# Patient Record
Sex: Female | Born: 2000 | Race: White | Hispanic: No | Marital: Married | Smoking: Former smoker
Health system: Southern US, Community
[De-identification: ages and names within clinical notes are randomized; demographics above are authoritative.]

## PROBLEM LIST (undated history)

## (undated) DIAGNOSIS — Z8041 Family history of malignant neoplasm of ovary: Secondary | ICD-10-CM

## (undated) DIAGNOSIS — Z789 Other specified health status: Secondary | ICD-10-CM

## (undated) DIAGNOSIS — K529 Noninfective gastroenteritis and colitis, unspecified: Secondary | ICD-10-CM

## (undated) DIAGNOSIS — U071 COVID-19: Secondary | ICD-10-CM

## (undated) HISTORY — DX: Family history of malignant neoplasm of ovary: Z80.41

## (undated) HISTORY — PX: WISDOM TOOTH EXTRACTION: SHX21

---
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
## 2018-01-27 ENCOUNTER — Ambulatory Visit
Admission: EM | Admit: 2018-01-27 | Discharge: 2018-01-27 | Disposition: A | Discharge status: Home / Self Care | Payer: Medicaid Other | Attending: Family Medicine | Admitting: Family Medicine

## 2018-01-27 DIAGNOSIS — R509 Fever, unspecified: Secondary | ICD-10-CM | POA: Insufficient documentation

## 2018-01-27 DIAGNOSIS — Z791 Long term (current) use of non-steroidal anti-inflammatories (NSAID): Secondary | ICD-10-CM | POA: Insufficient documentation

## 2018-01-27 DIAGNOSIS — Z79899 Other long term (current) drug therapy: Secondary | ICD-10-CM | POA: Diagnosis not present

## 2018-01-27 DIAGNOSIS — J029 Acute pharyngitis, unspecified: Secondary | ICD-10-CM | POA: Diagnosis present

## 2018-01-27 DIAGNOSIS — R110 Nausea: Secondary | ICD-10-CM | POA: Diagnosis not present

## 2018-01-27 LAB — RAPID STREP SCREEN (MED CTR MEBANE ONLY): Streptococcus, Group A Screen (Direct): NEGATIVE

## 2018-01-27 MED ORDER — LIDOCAINE VISCOUS 2 % MT SOLN: 10.0000 mL | Freq: Three times a day (TID) | OROMUCOSAL | 0 refills | Status: DC | PRN

## 2018-01-27 MED ORDER — ACETAMINOPHEN 500 MG PO TABS
1000.0000 mg | ORAL_TABLET | Freq: Once | ORAL | Status: AC
  Administered 2018-01-27: 1000 mg via ORAL

## 2018-01-27 MED ORDER — AMOXICILLIN 875 MG PO TABS: 875.0000 mg | ORAL_TABLET | Freq: Two times a day (BID) | ORAL | 0 refills | Status: DC

## 2018-01-27 NOTE — Discharge Instructions (Addendum)
Take medication as prescribed. Rest. Drink plenty of fluids.  ° °Follow up with your primary care physician this week as needed. Return to Urgent care for new or worsening concerns.  ° °

## 2018-01-27 NOTE — ED Provider Notes (Signed)
[facility] ____________________________________________  Time seen: Approximately 11:32 AM  I have reviewed the triage vital signs and the nursing notes.   HISTORY  Chief Complaint Sore Throat   HPI Catherine Dunn is a 17 y.o. female presenting for evaluation of sore throat, some body aches and overall not feeling well that started yesterday.  Reports fever starting today.  States some bilateral ear discomfort.  Has not taken any over-the-counter medications today for the same complaints.  States has been taken over-the-counter Tylenol and ibuprofen over the last several days as she had her wisdom teeth of 4 extracted this past Wednesday.  States that she is still having pain to all 4 widom teeth sites, but denies any acute increase of pain to those sites or any increased swelling.  States no accompanying runny nose, nasal congestion or nasal drainage.  Denies known direct sick contacts.  States pain to throat with swallowing, overall continues to tolerate fluids well, not eating solids quite as much.  Some nausea.  No vomiting or diarrhea or abdominal pain.  Denies dysuria.  Reports otherwise feels well. Denies chest pain, shortness of breath, abdominal pain, or rash. Denies recent sickness. Denies recent antibiotic use.    History reviewed. No pertinent past medical history.  There are no active problems to display for this patient.   History reviewed. No pertinent surgical history.   No current facility-administered medications for this encounter.   Current Outpatient Medications:  .  ibuprofen (ADVIL,MOTRIN) 200 MG tablet, Take 400 mg by mouth daily., Disp: , Rfl:  .  ibuprofen (ADVIL,MOTRIN) 600 MG tablet, Take 1 tablet (600 mg total) by mouth every 8 (eight) hours as needed., Disp: 15 tablet, Rfl: 0 .  norgestimate-ethinyl estradiol (ORTHO-CYCLEN,SPRINTEC,PREVIFEM) 0.25-35 MG-MCG tablet, Take 1 tablet by mouth daily., Disp: , Rfl:  .  amoxicillin (AMOXIL) 875 MG  tablet, Take 1 tablet (875 mg total) by mouth 2 (two) times daily., Disp: 20 tablet, Rfl: 0 .  lidocaine (XYLOCAINE) 2 % solution, Use as directed 10 mLs in the mouth or throat every 8 (eight) hours as needed (sore throat. gargle and spit as needed for sore throat.)., Disp: 100 mL, Rfl: 0 .  promethazine (PHENERGAN) 25 MG tablet, Take 1 tablet (25 mg total) by mouth every 6 (six) hours as needed for nausea or vomiting., Disp: 15 tablet, Rfl: 0  Allergies Patient has no known allergies.  No family history on file.  Social History Social History   Tobacco Use  . Smoking status: Never Smoker  . Smokeless tobacco: Never Used  Substance Use Topics  . Alcohol use: No  . Drug use: No    Review of Systems Constitutional: Reports subjective fever today. ENT: positive sore throat. Cardiovascular: Denies chest pain. Respiratory: Denies shortness of breath. Gastrointestinal: No abdominal pain.  Reports some nausea.  No vomiting.  No diarrhea.   Genitourinary: Negative for dysuria. Musculoskeletal: Negative for back pain. Skin: Negative for rash.   ____________________________________________   PHYSICAL EXAM:  VITAL SIGNS: ED Triage Vitals  Enc Vitals Group     BP 01/27/18 1107 (!) 141/88     Pulse Rate 01/27/18 1107 (!) 113     Resp 01/27/18 1107 18     Temp 01/27/18 1107 (!) 100.8 F (38.2 C)     Temp Source 01/27/18 1107 Oral     SpO2 01/27/18 1107 98 %     Weight --      Height --      Head Circumference --  Peak Flow --      Pain Score 01/27/18 1108 6     Pain Loc --      Pain Edu? --      Excl. in GC? --     Constitutional: Alert and oriented. Well appearing and in no acute distress. Eyes: Conjunctivae are normal Head: Atraumatic. No sinus tenderness to palpation. No swelling. No erythema.  Ears: no erythema, normal TMs bilaterally.   Nose:No nasal congestion  Mouth/Throat: Mucous membranes are moist.moderate pharyngeal erythema with 2+ bilateral tonsillar  swelling, no exudate.  No uvular shift or deviation.  X4 wisdom tooth extraction sites mildly tender and mild edema, no erythema or visible or palpated fluctuance. Neck: No stridor.  No cervical spine tenderness to palpation. Hematological/Lymphatic/Immunilogical: Anterior bilateral cervical lymphadenopathy. Cardiovascular: Normal rate, regular rhythm. Grossly normal heart sounds.  Good peripheral circulation. Respiratory: Normal respiratory effort.  No retractions. No wheezes, rales or rhonchi. Good air movement.  Gastrointestinal: Soft and nontender.  Musculoskeletal: Ambulatory with steady gait. No cervical, thoracic or lumbar tenderness to palpation. Neurologic:  Normal speech and language. No gait instability. Skin:  Skin appears warm, dry and intact. No rash noted. Psychiatric: Mood and affect are normal. Speech and behavior are normal.  ___________________________________________   LABS (all labs ordered are listed, but only abnormal results are displayed)  Labs Reviewed  RAPID STREP SCREEN ([facility] ONLY)  CULTURE, GROUP A STREP [facility])   ____________________________________________   PROCEDURES Procedures    INITIAL IMPRESSION / ASSESSMENT AND PLAN / ED COURSE  Pertinent labs & imaging results that were available during my care of the patient were reviewed by me and considered in my medical decision making (see chart for details).  Very well-appearing patient.  No acute distress.  Febrile, has not taken any over-the-counter medications today prior to arrival, 1 g oral Tylenol given in urgent care.  Patient with recent wisdom tooth extraction, sites with mild tenderness and swelling, no erythema.  Moderate pharyngeal erythema.  Denies accompanying nasal congestion or cough.  Strep negative, will culture.  However suspect strep cough, pharyngitis.  Will initiate oral treatment with oral amoxicillin and PRN viscous lidocaine..  Encourage rest, fluids, supportive care.  Discussed  very strict follow-up and return parameters.Discussed indication, risks and benefits of medications with patient.  Discussed follow up with Primary care physician this week. Discussed follow up and return parameters including no resolution or any worsening concerns. Patient verbalized understanding and agreed to plan.   ____________________________________________   FINAL CLINICAL IMPRESSION(S) / ED DIAGNOSES  Final diagnoses:  Pharyngitis, unspecified etiology     ED Discharge Orders        Ordered    amoxicillin (AMOXIL) 875 MG tablet  2 times daily     01/27/18 1131    lidocaine (XYLOCAINE) 2 % solution  Every 8 hours PRN     01/27/18 1131       Note: This dictation was prepared with Dragon dictation along with smaller phrase technology. Any transcriptional errors that result from this process are unintentional.         Renford Dills, NP 01/27/18 [phone]

## 2018-01-27 NOTE — ED Triage Notes (Signed)
Pt here for sore throat, headache, bilateral ear pain but right ear is worse and nausea. Did get wisdom teeth removed on Wednesday. Has been taking ibuprofen and tylenol for the pain. Last dose last night.

## 2018-01-30 LAB — CULTURE, GROUP A STREP (THRC)

## 2018-04-04 ENCOUNTER — Other Ambulatory Visit: Payer: Self-pay

## 2018-04-04 ENCOUNTER — Ambulatory Visit
Admission: EM | Admit: 2018-04-04 | Discharge: 2018-04-04 | Disposition: A | Discharge status: Home / Self Care | Payer: Medicaid Other | Attending: Family Medicine | Admitting: Family Medicine

## 2018-04-04 DIAGNOSIS — J0100 Acute maxillary sinusitis, unspecified: Secondary | ICD-10-CM | POA: Diagnosis not present

## 2018-04-04 MED ORDER — DOXYCYCLINE HYCLATE 100 MG PO CAPS: 100.0000 mg | ORAL_CAPSULE | Freq: Two times a day (BID) | ORAL | 0 refills | Status: DC

## 2018-04-04 NOTE — ED Provider Notes (Signed)
[facility]    CSN: [id number] Arrival date & time: 04/04/18  1906     History   Chief Complaint Chief Complaint  Patient presents with  . Cough    HPI Catherine Dunn is a 17 y.o. female.   HPI  17 year old female accompanied by her boyfriend presents with a runny nose cough congestion  since Monday.  Seen by her primary care physician on Tuesday diagnosed with a sinusitis and prescribed amoxicillin.  Since she started taking amoxicillin she has had the nausea vomiting and diarrhea.  She has had amoxicillin in the past without problems at this time is worse.  Has a lot of nasal congestion and facial pain.       History reviewed. No pertinent past medical history.  There are no active problems to display for this patient.   Past Surgical History:  Procedure Laterality Date  . WISDOM TOOTH EXTRACTION      OB History   None      Home Medications    Prior to Admission medications   Medication Sig Start Date End Date Taking? Authorizing Provider  amoxicillin (AMOXIL) 875 MG tablet Take 1 tablet (875 mg total) by mouth 2 (two) times daily. 01/27/18  Yes Renford DillsMiller, Lindsey, NP  norgestimate-ethinyl estradiol (ORTHO-CYCLEN,SPRINTEC,PREVIFEM) 0.25-35 MG-MCG tablet Take 1 tablet by mouth daily.   Yes [provider]  doxycycline (VIBRAMYCIN) 100 MG capsule Take 1 capsule (100 mg total) by mouth 2 (two) times daily. 04/04/18   Lutricia Feiloemer, Mescal Flinchbaugh P, PA-C    Family History Family History  Problem Relation Age of Onset  . COPD Mother   . Heart attack Mother   . Diverticulitis Mother   . Hypertension Mother   . Ovarian cancer Mother     Social History Social History   Tobacco Use  . Smoking status: Never Smoker  . Smokeless tobacco: Never Used  Substance Use Topics  . Alcohol use: Yes    Comment: occasionally  . Drug use: No     Allergies   Patient has no known allergies.   Review of Systems Review of Systems  Constitutional: Positive for  activity change and fatigue. Negative for chills and fever.  HENT: Positive for congestion, postnasal drip, rhinorrhea, sinus pressure and sinus pain.   Respiratory: Negative for cough.   Gastrointestinal: Positive for diarrhea, nausea and vomiting.  All other systems reviewed and are negative.    Physical Exam Triage Vital Signs ED Triage Vitals  Enc Vitals Group     BP 04/04/18 1931 130/76     Pulse Rate 04/04/18 1931 88     Resp 04/04/18 1931 18     Temp 04/04/18 1931 98.8 F (37.1 C)     Temp Source 04/04/18 1931 Oral     SpO2 04/04/18 1931 99 %     Weight 04/04/18 1926 104 lb (47.2 kg)     Height 04/04/18 1926 5\' 1"  (1.549 m)     Head Circumference --      Peak Flow --      Pain Score 04/04/18 1926 3     Pain Loc --      Pain Edu? --      Excl. in GC? --    No data found.  Updated Vital Signs BP 130/76 (BP Location: Left Arm)   Pulse 88   Temp 98.8 F (37.1 C) (Oral)   Resp 18   Ht 5\' 1"  (1.549 m)   Wt 104 lb (47.2 kg)  LMP 03/25/2018   SpO2 99%   BMI 19.65 kg/m   Visual Acuity Right Eye Distance:   Left Eye Distance:   Bilateral Distance:    Right Eye Near:   Left Eye Near:    Bilateral Near:     Physical Exam  Constitutional: She is oriented to person, place, and time. She appears well-developed and well-nourished. No distress.  HENT:  Head: Normocephalic.  Right Ear: External ear normal.  Left Ear: External ear normal.  Nose: Nose normal.  Mouth/Throat: Oropharynx is clear and moist. No oropharyngeal exudate.  Patient has mild tenderness to percussion over the maxillary sinuses  Eyes: Pupils are equal, round, and reactive to light. Right eye exhibits no discharge. Left eye exhibits no discharge.  Neck: Normal range of motion.  Pulmonary/Chest: Effort normal and breath sounds normal.  Abdominal: Soft. Bowel sounds are normal. There is no tenderness. There is no rebound and no guarding.  Musculoskeletal: Normal range of motion.    Lymphadenopathy:    She has no cervical adenopathy.  Neurological: She is alert and oriented to person, place, and time.  Skin: Skin is warm and dry. She is not diaphoretic.  Psychiatric: She has a normal mood and affect. Her behavior is normal. Judgment and thought content normal.  Nursing note and vitals reviewed.    UC Treatments / Results  Labs (all labs ordered are listed, but only abnormal results are displayed) Labs Reviewed - No data to display  EKG None  Radiology No results found.  Procedures Procedures (including critical care time)  Medications Ordered in UC Medications - No data to display  Initial Impression / Assessment and Plan / UC Course  I have reviewed the triage vital signs and the nursing notes.  Pertinent labs & imaging results that were available during my care of the patient were reviewed by me and considered in my medical decision making (see chart for details).     Plan: 1. Test/x-ray results and diagnosis reviewed with patient 2. rx as per orders; risks, benefits, potential side effects reviewed with patient 3. Recommend supportive treatment with Flonase nasal spray daily for the next 2 to 3 weeks.  The patient has already been started on antibiotics for sinusitis scribed by her primary care physician and with her reaction to the amoxicillin at the present time accounting for her nausea vomiting and diarrhea I will switch her now to doxycycline.  I told her this could likely be a virus and does not require antibiotics and if she does not improve on the doxycycline then that would be more convincing.  If she continues to have problems I recommend she follow-up with her primary care physician.  I cautioned her also to drink plenty of fluids to replenish the fluids that she is losing. 4. F/u prn if symptoms worsen or don't improve  Final Clinical Impressions(s) / UC Diagnoses   Final diagnoses:  Acute maxillary sinusitis, recurrence not specified    Discharge Instructions   None    ED Prescriptions    Medication Sig Dispense Auth. Provider   doxycycline (VIBRAMYCIN) 100 MG capsule Take 1 capsule (100 mg total) by mouth 2 (two) times daily. 14 capsule Lutricia Feil, PA-C     Controlled Substance Prescriptions [place] Controlled Substance Registry consulted? Not Applicable   Lutricia Feil, PA-C 04/04/18 2011

## 2018-04-04 NOTE — ED Triage Notes (Addendum)
Patient complains of runny nose, cough, congestion, vomiting and diarrhea since Monday. Patient states that she was seen by PCP on Tuesday and given amox. States that nausea and vomiting always occur with the amox.

## 2018-07-05 ENCOUNTER — Encounter: Payer: Self-pay | Admitting: *Deleted

## 2018-07-05 ENCOUNTER — Other Ambulatory Visit: Payer: Self-pay

## 2018-07-05 ENCOUNTER — Emergency Department: Payer: Self-pay

## 2018-07-05 ENCOUNTER — Emergency Department
Admission: EM | Admit: 2018-07-05 | Discharge: 2018-07-05 | Disposition: A | Discharge status: Home / Self Care | Payer: Self-pay | Attending: Emergency Medicine | Admitting: Emergency Medicine

## 2018-07-05 DIAGNOSIS — Z79899 Other long term (current) drug therapy: Secondary | ICD-10-CM | POA: Insufficient documentation

## 2018-07-05 DIAGNOSIS — Y93E8 Activity, other personal hygiene: Secondary | ICD-10-CM | POA: Insufficient documentation

## 2018-07-05 DIAGNOSIS — Z23 Encounter for immunization: Secondary | ICD-10-CM | POA: Insufficient documentation

## 2018-07-05 DIAGNOSIS — S61511A Laceration without foreign body of right wrist, initial encounter: Secondary | ICD-10-CM | POA: Insufficient documentation

## 2018-07-05 DIAGNOSIS — W25XXXA Contact with sharp glass, initial encounter: Secondary | ICD-10-CM | POA: Insufficient documentation

## 2018-07-05 DIAGNOSIS — W182XXA Fall in (into) shower or empty bathtub, initial encounter: Secondary | ICD-10-CM | POA: Insufficient documentation

## 2018-07-05 DIAGNOSIS — Y999 Unspecified external cause status: Secondary | ICD-10-CM | POA: Insufficient documentation

## 2018-07-05 DIAGNOSIS — Y92012 Bathroom of single-family (private) house as the place of occurrence of the external cause: Secondary | ICD-10-CM | POA: Insufficient documentation

## 2018-07-05 DIAGNOSIS — S91012A Laceration without foreign body, left ankle, initial encounter: Secondary | ICD-10-CM | POA: Insufficient documentation

## 2018-07-05 MED ORDER — TETANUS-DIPHTH-ACELL PERTUSSIS 5-2.5-18.5 LF-MCG/0.5 IM SUSP
0.5000 mL | Freq: Once | INTRAMUSCULAR | Status: AC
  Administered 2018-07-05: 0.5 mL via INTRAMUSCULAR
  Filled 2018-07-05: qty 0.5

## 2018-07-05 NOTE — ED Provider Notes (Signed)
[facility] Emergency Department Provider Note  ____________________________________________  Time seen: Approximately 4:01 PM  I have reviewed the triage vital signs and the nursing notes.   HISTORY  Chief Complaint No chief complaint on file.    HPI Catherine Dunn is a 17 y.o. female who presents the emergency department via EMS after sustaining an injury to the right wrist and left ankle.  Patient reports that she was getting out of the shower, she bumped against a mirror in the bathroom causing her to fall.  When the mirror fell, it broke, causing a laceration to the anterior right wrist and the left lateral ankle.  Bleeding was controlled with direct pressure.  Patient has full range of motion to the hand, all digits right hand.  Full range of motion to the ankle and all digits left foot.  Unsure of last tetanus shot.  Patient denies any other injuries or complaints at this time.    History reviewed. No pertinent past medical history.  There are no active problems to display for this patient.   Past Surgical History:  Procedure Laterality Date  . WISDOM TOOTH EXTRACTION      Prior to Admission medications   Medication Sig Start Date End Date Taking? Authorizing Provider  amoxicillin (AMOXIL) 875 MG tablet Take 1 tablet (875 mg total) by mouth 2 (two) times daily. 01/27/18   Renford Dills, NP  doxycycline (VIBRAMYCIN) 100 MG capsule Take 1 capsule (100 mg total) by mouth 2 (two) times daily. 04/04/18   Lutricia Feil, PA-C  norgestimate-ethinyl estradiol (ORTHO-CYCLEN,SPRINTEC,PREVIFEM) 0.25-35 MG-MCG tablet Take 1 tablet by mouth daily.    [provider]    Allergies Patient has no known allergies.  Family History  Problem Relation Age of Onset  . COPD Mother   . Heart attack Mother   . Diverticulitis Mother   . Hypertension Mother   . Ovarian cancer Mother     Social History Social History   Tobacco Use  . Smoking status:  Never Smoker  . Smokeless tobacco: Never Used  Substance Use Topics  . Alcohol use: Yes    Comment: occasionally  . Drug use: No     Review of Systems  Constitutional: No fever/chills Eyes: No visual changes. No discharge ENT: No upper respiratory complaints. Cardiovascular: no chest pain. Respiratory: no cough. No SOB. Gastrointestinal: No abdominal pain.  No nausea, no vomiting.  Musculoskeletal: Negative for musculoskeletal pain. Skin: Positive for laceration to the right wrist and left ankle Neurological: Negative for headaches, focal weakness or numbness. 10-point ROS otherwise negative.  ____________________________________________   PHYSICAL EXAM:  VITAL SIGNS: ED Triage Vitals  Enc Vitals Group     BP 07/05/18 1551 133/83     Pulse Rate 07/05/18 1551 100     Resp 07/05/18 1551 16     Temp 07/05/18 1551 98.5 F (36.9 C)     Temp Source 07/05/18 1551 Oral     SpO2 07/05/18 1551 98 %     Weight 07/05/18 1553 104 lb (47.2 kg)     Height 07/05/18 1553 5\' 1"  (1.549 m)     Head Circumference --      Peak Flow --      Pain Score 07/05/18 1552 8     Pain Loc --      Pain Edu? --      Excl. in GC? --      Constitutional: Alert and oriented. Well appearing and in no acute distress. Eyes:  Conjunctivae are normal. PERRL. EOMI. Head: Atraumatic. Neck: No stridor.    Cardiovascular: Normal rate, regular rhythm. Normal S1 and S2.  Good peripheral circulation. Respiratory: Normal respiratory effort without tachypnea or retractions. Lungs CTAB. Good air entry to the bases with no decreased or absent breath sounds. Musculoskeletal: Full range of motion to all extremities. No gross deformities appreciated. Neurologic:  Normal speech and language. No gross focal neurologic deficits are appreciated.  Skin:  Skin is warm, dry and intact. No rash noted.  Visualization of the right anterior wrist reveals linear laceration measuring approximately 1 cm in length.  Edges are well  approximated.  No active bleeding.  No visible foreign body.  Area is very tender to palpation with no palpable abnormality.  Radial pulse intact.  Sensation intact x5 digits.  Full range of motion to all 5 digits.  Examination of the left ankle reveals superficial laceration measuring approximately 6 cm in length.  No bleeding.  No visible foreign body.  Full range of motion to the ankle joint and all digits left foot.  Dorsalis pedis pulse intact to left foot.  Cap refill less than 2 seconds left foot. Psychiatric: Mood and affect are normal. Speech and behavior are normal. Patient exhibits appropriate insight and judgement.   ____________________________________________   LABS (all labs ordered are listed, but only abnormal results are displayed)  Labs Reviewed - No data to display ____________________________________________  EKG   ____________________________________________  RADIOLOGY I personally viewed and evaluated these images as part of my medical decision making, as well as reviewing the written report by the radiologist.  No visible foreign body on imaging.  Dg Wrist Complete Right  Result Date: 07/05/2018 CLINICAL DATA:  Right wrist laceration EXAM: RIGHT WRIST - COMPLETE 3+ VIEW COMPARISON:  None. FINDINGS: There is no evidence of fracture or dislocation. There is no evidence of arthropathy or other focal bone abnormality. Soft tissues are unremarkable. IMPRESSION: No acute osseous injury of the right wrist. Electronically Signed   By: Elige Ko   On: 07/05/2018 16:36    ____________________________________________    PROCEDURES  Procedure(s) performed:    Marland KitchenMarland KitchenLaceration Repair Date/Time: 07/05/2018 4:23 PM Performed by: Racheal Patches, PA-C Authorized by: Racheal Patches, PA-C   Consent:    Consent obtained:  Verbal   Consent given by:  Patient   Risks discussed:  Pain Anesthesia (see MAR for exact dosages):    Anesthesia method:   None Laceration details:    Location:  Shoulder/arm   Shoulder/arm location:  R lower arm   Length (cm):  1 Repair type:    Repair type:  Simple Pre-procedure details:    Preparation:  Imaging obtained to evaluate for foreign bodies Exploration:    Hemostasis achieved with:  Direct pressure   Wound exploration: wound explored through full range of motion and entire depth of wound probed and visualized     Wound extent: no foreign bodies/material noted, no muscle damage noted, no nerve damage noted, no tendon damage noted, no underlying fracture noted and no vascular damage noted     Contaminated: no   Treatment:    Area cleansed with:  Betadine and saline   Amount of cleaning:  Extensive   Irrigation solution:  Sterile saline   Irrigation volume:  1L   Irrigation method:  Syringe Skin repair:    Repair method:  Tissue adhesive Approximation:    Approximation:  Close Post-procedure details:    Dressing:  Splint for protection  Patient tolerance of procedure:  Tolerated well, no immediate complications .Marland KitchenLaceration Repair Date/Time: 07/05/2018 4:24 PM Performed by: Racheal Patches, PA-C Authorized by: Racheal Patches, PA-C   Consent:    Consent obtained:  Verbal   Consent given by:  Patient   Risks discussed:  Pain Anesthesia (see MAR for exact dosages):    Anesthesia method:  None Laceration details:    Location:  Leg   Leg location:  L lower leg   Length (cm):  6 Repair type:    Repair type:  Simple Pre-procedure details:    Preparation:  Patient was prepped and draped in usual sterile fashion and imaging obtained to evaluate for foreign bodies Exploration:    Hemostasis achieved with:  Direct pressure   Wound exploration: wound explored through full range of motion and entire depth of wound probed and visualized     Wound extent: no foreign bodies/material noted, no muscle damage noted, no nerve damage noted, no tendon damage noted, no underlying  fracture noted and no vascular damage noted   Treatment:    Area cleansed with:  Betadine and saline   Amount of cleaning:  Extensive   Irrigation solution:  Sterile saline   Irrigation volume:  1 L   Irrigation method:  Syringe Skin repair:    Repair method:  Tissue adhesive Approximation:    Approximation:  Close Post-procedure details:    Dressing:  Open (no dressing)   Patient tolerance of procedure:  Tolerated well, no immediate complications      Medications  Tdap (BOOSTRIX) injection 0.5 mL (0.5 mLs Intramuscular Given 07/05/18 1632)     ____________________________________________   INITIAL IMPRESSION / ASSESSMENT AND PLAN / ED COURSE  Pertinent labs & imaging results that were available during my care of the patient were reviewed by me and considered in my medical decision making (see chart for details).  Review of the [place] CSRS was performed in accordance of the [facility] prior to dispensing any controlled drugs.      Patient's diagnosis is consistent with laceration to the right wrist and left ankle.  Patient presented after sustaining lacerations to the right wrist and left ankle from a broken mirror.  Patient was unsure of her last tetanus shot this was updated today.  Imaging of the wrist reveals no visible foreign body.  Wound care was performed as described above.  Wound care instructions are discussed with patient..  Tylenol and/or Motrin at home as needed for pain.  Follow-up with primary care as needed.  Patient is given ED precautions to return to the ED for any worsening or new symptoms.     ____________________________________________  FINAL CLINICAL IMPRESSION(S) / ED DIAGNOSES  Final diagnoses:  Laceration of right wrist, initial encounter  Laceration of left ankle, initial encounter      NEW MEDICATIONS STARTED DURING THIS VISIT:  ED Discharge Orders    None          This chart was dictated using voice recognition software/Dragon.  Despite best efforts to proofread, errors can occur which can change the meaning. Any change was purely unintentional.    Racheal Patches, PA-C 07/05/18 1702    Emily Filbert, MD 07/10/18 [phone]

## 2018-07-05 NOTE — ED Notes (Signed)
Radiology at bedside. Both lacerations cleaned by this RN. Bleeding still under control at this time.

## 2018-07-05 NOTE — ED Triage Notes (Signed)
Pt to ED via EMS with a laceration to the right forearm and left leg. Color of right arm is appropriate upon arrival and movement is intact. Bleeding controlled upon arrival. No arterial bleeding.

## 2018-07-24 ENCOUNTER — Encounter: Payer: Self-pay | Admitting: Emergency Medicine

## 2018-07-24 ENCOUNTER — Emergency Department: Payer: Self-pay

## 2018-07-24 ENCOUNTER — Emergency Department
Admission: EM | Admit: 2018-07-24 | Discharge: 2018-07-24 | Disposition: A | Discharge status: Home / Self Care | Payer: Self-pay | Attending: Emergency Medicine | Admitting: Emergency Medicine

## 2018-07-24 ENCOUNTER — Other Ambulatory Visit: Payer: Self-pay

## 2018-07-24 DIAGNOSIS — Z79899 Other long term (current) drug therapy: Secondary | ICD-10-CM | POA: Insufficient documentation

## 2018-07-24 DIAGNOSIS — R102 Pelvic and perineal pain: Secondary | ICD-10-CM

## 2018-07-24 DIAGNOSIS — B379 Candidiasis, unspecified: Secondary | ICD-10-CM

## 2018-07-24 DIAGNOSIS — B3731 Acute candidiasis of vulva and vagina: Secondary | ICD-10-CM

## 2018-07-24 DIAGNOSIS — B373 Candidiasis of vulva and vagina: Secondary | ICD-10-CM | POA: Insufficient documentation

## 2018-07-24 LAB — COMPREHENSIVE METABOLIC PANEL
ALT: 13 U/L (ref 0–44)
AST: 18 U/L (ref 15–41)
Albumin: 4.2 g/dL (ref 3.5–5.0)
Alkaline Phosphatase: 62 U/L (ref 38–126)
Anion gap: 8 (ref 5–15)
BUN: 10 mg/dL (ref 6–20)
CHLORIDE: 101 mmol/L (ref 98–111)
CO2: 29 mmol/L (ref 22–32)
CREATININE: 0.63 mg/dL (ref 0.44–1.00)
Calcium: 9.4 mg/dL (ref 8.9–10.3)
GFR calc Af Amer: >60 mL/min (ref >60)
Glucose, Bld: 114 mg/dL — ABNORMAL HIGH (ref 70–99)
Potassium: 4 mmol/L (ref 3.5–5.1)
Sodium: 138 mmol/L (ref 135–145)
Total Bilirubin: 0.4 mg/dL (ref 0.3–1.2)
Total Protein: 7.9 g/dL (ref 6.5–8.1)

## 2018-07-24 LAB — CHLAMYDIA/NGC RT PCR (ARMC ONLY)
Chlamydia Tr: NOT DETECTED
N GONORRHOEAE: NOT DETECTED

## 2018-07-24 LAB — URINALYSIS, COMPLETE (UACMP) WITH MICROSCOPIC
BACTERIA UA: NONE SEEN
Bilirubin Urine: NEGATIVE
Glucose, UA: NEGATIVE mg/dL
Hgb urine dipstick: NEGATIVE
Ketones, ur: NEGATIVE mg/dL
Leukocytes, UA: NEGATIVE
Nitrite: NEGATIVE
Protein, ur: NEGATIVE mg/dL
SPECIFIC GRAVITY, URINE: 1.014 (ref 1.005–1.030)
WBC, UA: NONE SEEN WBC/hpf (ref 0–5)
pH: 7 (ref 5.0–8.0)

## 2018-07-24 LAB — CBC
HEMATOCRIT: 40.5 % (ref 36.0–46.0)
Hemoglobin: 13 g/dL (ref 12.0–15.0)
MCH: 26.7 pg (ref 26.0–34.0)
MCHC: 32.1 g/dL (ref 30.0–36.0)
MCV: 83.3 fL (ref 80.0–100.0)
Platelets: 287 10*3/uL (ref 150–400)
RBC: 4.86 MIL/uL (ref 3.87–5.11)
RDW: 13 % (ref 11.5–15.5)
WBC: 5.7 10*3/uL (ref 4.0–10.5)
nRBC: 0 % (ref 0.0–0.2)

## 2018-07-24 LAB — WET PREP, GENITAL
Clue Cells Wet Prep HPF POC: NONE SEEN
Sperm: NONE SEEN
Trich, Wet Prep: NONE SEEN

## 2018-07-24 LAB — POCT PREGNANCY, URINE: PREG TEST UR: NEGATIVE

## 2018-07-24 LAB — LIPASE, BLOOD: LIPASE: 34 U/L (ref 11–51)

## 2018-07-24 MED ORDER — FLUCONAZOLE 50 MG PO TABS
150.0000 mg | ORAL_TABLET | Freq: Once | ORAL | Status: AC
  Administered 2018-07-24: 150 mg via ORAL
  Filled 2018-07-24: qty 1

## 2018-07-24 NOTE — ED Triage Notes (Addendum)
Patient ambulatory to triage with steady gait, without difficulty or distress noted; pt reports lower abd pain accomp by nausea and HA since this am; st hx of same with "sinus infections"

## 2018-07-24 NOTE — ED Notes (Signed)
Ultrasound at bedside. Taking pt for scan. No distress noted at this time.

## 2018-07-24 NOTE — ED Notes (Signed)
Pt returned from ultrasound. Visitor at the bedside.

## 2018-07-24 NOTE — ED Notes (Signed)
Dr. Brown at the bedside for pt evaluation 

## 2018-07-24 NOTE — ED Provider Notes (Signed)
[facility] Emergency Department Provider Note    First MD Initiated Contact with Patient 07/24/18 [phone]     (approximate)  I have reviewed the triage vital signs and the nursing notes.   HISTORY  Chief Complaint Abdominal Pain    HPI Catherine Dunn is a 17 y.o. female presents to the emergency department with bilateral pelvic discomfort with accompanying nausea x1 day.  Patient denies any diarrhea or constipation.  Patient denies any fever.  Patient denies any urinary symptoms.  Patient denies any vaginal discharge patient states last menstrual period 2 weeks ago which was "normal".  Patient states current pain score is 4 out of 10   Past medical history None There are no active problems to display for this patient.   Past Surgical History:  Procedure Laterality Date  . WISDOM TOOTH EXTRACTION      Prior to Admission medications   Medication Sig Start Date End Date Taking? Authorizing Provider  amoxicillin (AMOXIL) 875 MG tablet Take 1 tablet (875 mg total) by mouth 2 (two) times daily. 01/27/18   Renford Dills, NP  doxycycline (VIBRAMYCIN) 100 MG capsule Take 1 capsule (100 mg total) by mouth 2 (two) times daily. 04/04/18   Lutricia Feil, PA-C  norgestimate-ethinyl estradiol (ORTHO-CYCLEN,SPRINTEC,PREVIFEM) 0.25-35 MG-MCG tablet Take 1 tablet by mouth daily.    [provider]    Allergies No known drug allergies  Family History  Problem Relation Age of Onset  . COPD Mother   . Heart attack Mother   . Diverticulitis Mother   . Hypertension Mother   . Ovarian cancer Mother     Social History Social History   Tobacco Use  . Smoking status: Never Smoker  . Smokeless tobacco: Never Used  Substance Use Topics  . Alcohol use: Yes    Comment: occasionally  . Drug use: No    Review of Systems Constitutional: No fever/chills Eyes: No visual changes. ENT: No sore throat. Cardiovascular: Denies chest pain. Respiratory:  Denies shortness of breath. Gastrointestinal: No abdominal pain.  No nausea, no vomiting.  No diarrhea.  No constipation. Genitourinary: Negative for dysuria. Musculoskeletal: Negative for neck pain.  Negative for back pain. Integumentary: Negative for rash. Neurological: Negative for headaches, focal weakness or numbness.   ____________________________________________   PHYSICAL EXAM:  VITAL SIGNS: ED Triage Vitals  Enc Vitals Group     BP 07/24/18 0022 (!) 146/96     Pulse Rate 07/24/18 0022 79     Resp 07/24/18 0022 17     Temp 07/24/18 0022 98 F (36.7 C)     Temp Source 07/24/18 0022 Oral     SpO2 07/24/18 0303 100 %     Weight 07/24/18 0011 47.2 kg (104 lb)     Height 07/24/18 0011 1.549 m (5\' 1" )     Head Circumference --      Peak Flow --      Pain Score 07/24/18 0011 5     Pain Loc --      Pain Edu? --      Excl. in GC? --     Constitutional: Alert and oriented. Well appearing and in no acute distress. Eyes: Conjunctivae are normal. PERRL. EOMI. Head: Atraumatic. Mouth/Throat: Mucous membranes are moist.  Oropharynx non-erythematous. Neck: No stridor.   Cardiovascular: Normal rate, regular rhythm. Good peripheral circulation. Grossly normal heart sounds. Respiratory: Normal respiratory effort.  No retractions. Lungs CTAB. Gastrointestinal: Soft and nontender. No distention.  Genitourinary: Positive for thick white  vaginal discharge Musculoskeletal: No lower extremity tenderness nor edema. No gross deformities of extremities. Neurologic:  Normal speech and language. No gross focal neurologic deficits are appreciated.  Skin:  Skin is warm, dry and intact. No rash noted. Psychiatric: Mood and affect are normal. Speech and behavior are normal.  ____________________________________________   LABS (all labs ordered are listed, but only abnormal results are displayed)  Labs Reviewed  WET PREP, GENITAL - Abnormal; Notable for the following components:       Result Value   Yeast Wet Prep HPF POC PRESENT (*)    WBC, Wet Prep HPF POC MODERATE (*)    All other components within normal limits  COMPREHENSIVE METABOLIC PANEL - Abnormal; Notable for the following components:   Glucose, Bld 114 (*)    All other components within normal limits  URINALYSIS, COMPLETE (UACMP) WITH MICROSCOPIC - Abnormal; Notable for the following components:   Color, Urine YELLOW (*)    APPearance CLEAR (*)    All other components within normal limits  CHLAMYDIA/NGC RT PCR ([facility] ONLY)  LIPASE, BLOOD  CBC  POC URINE PREG, ED  POCT PREGNANCY, URINE     RADIOLOGY I, Alex N Mahlon Gabrielle, personally viewed and evaluated these images (plain radiographs) as part of my medical decision making, as well as reviewing the written report by the radiologist.  ED MD interpretation: Pelvic ultrasound revealed no acute pathology per radiologist. Official radiology report(s): US Pelvic Complete With Transvaginal  Result Date: 07/24/2018 CLINICAL DATA:  Initial evaluation for acute pelvic pain. EXAM: TRANSABDOMINAL AND TRANSVAGINAL ULTRASOUND OF PELVIS TECHNIQUE: Both transabdominal and transvaginal ultrasound examinations of the pelvis were performed. Transabdominal technique was performed for global imaging of the pelvis including uterus, ovaries, adnexal regions, and pelvic cul-de-sac. It was necessary to proceed with endovaginal exam following the transabdominal exam to visualize the uterus, endometrium, and ovaries. COMPARISON:  None FINDINGS: Uterus Measurements: 7.5 x 3.6 x 4.8 cm. No fibroids or other mass visualized. Endometrium Thickness: 6.1 mm.  No focal abnormality visualized. Right ovary Measurements: 3.5 x 1.8 x 1.8 cm. Normal appearance/no adnexal mass. Left ovary Measurements: 2.8 x 1.4 x 3.0 cm. Normal appearance/no adnexal mass. Other findings Trace free physiologic fluid present within the pelvis. IMPRESSION: Normal pelvic ultrasound.  No acute abnormality  identified. Electronically Signed   By: Rise Mu M.D.   On: 07/24/2018 04:26      Procedures   ____________________________________________   INITIAL IMPRESSION / ASSESSMENT AND PLAN / ED COURSE  As part of my medical decision making, I reviewed the following data within the electronic MEDICAL RECORD NUMBER   17 year old female presenting with above-stated history and physical exam secondary to pelvic discomfort considered possibility of ovarian cysts ovarian and as such ultrasound was performed which revealed no acute pathology.  Also considered possibly PID outpatient with no cervical motion tenderness or any other clinical findings consistent with PID.  Vaginal swab consistent with yeast vaginitis.   ____________________________________________  FINAL CLINICAL IMPRESSION(S) / ED DIAGNOSES  Final diagnoses:  Pelvic pain  Yeast infection  Yeast vaginitis     MEDICATIONS GIVEN DURING THIS VISIT:  Medications  fluconazole (DIFLUCAN) tablet 150 mg (150 mg Oral Given 07/24/18 1610)     ED Discharge Orders    None       Note:  This document was prepared using Dragon voice recognition software and may include unintentional dictation errors.    Darci Current, MD 07/24/18 [phone]

## 2018-07-24 NOTE — ED Notes (Addendum)
Dr. Manson Passey at the bedside for pelvic exam with ED [facility].

## 2018-07-24 NOTE — ED Notes (Signed)
Pt resting on stretcher with visitor at the bedside. No distress noted at this time.

## 2018-07-29 ENCOUNTER — Other Ambulatory Visit: Payer: Self-pay

## 2018-07-29 ENCOUNTER — Encounter: Payer: Self-pay | Admitting: Emergency Medicine

## 2018-07-29 ENCOUNTER — Emergency Department
Admission: EM | Admit: 2018-07-29 | Discharge: 2018-07-29 | Disposition: A | Discharge status: Home / Self Care | Payer: Self-pay | Attending: Emergency Medicine | Admitting: Emergency Medicine

## 2018-07-29 DIAGNOSIS — R102 Pelvic and perineal pain: Secondary | ICD-10-CM | POA: Insufficient documentation

## 2018-07-29 LAB — URINALYSIS, COMPLETE (UACMP) WITH MICROSCOPIC
BILIRUBIN URINE: NEGATIVE
Glucose, UA: NEGATIVE mg/dL
HGB URINE DIPSTICK: NEGATIVE
Ketones, ur: NEGATIVE mg/dL
Leukocytes, UA: NEGATIVE
Nitrite: NEGATIVE
Protein, ur: NEGATIVE mg/dL
SPECIFIC GRAVITY, URINE: 1.012 (ref 1.005–1.030)
pH: 7 (ref 5.0–8.0)

## 2018-07-29 LAB — COMPREHENSIVE METABOLIC PANEL
ALBUMIN: 4.4 g/dL (ref 3.5–5.0)
ALK PHOS: 61 U/L (ref 38–126)
ALT: 15 U/L (ref 0–44)
AST: 19 U/L (ref 15–41)
Anion gap: 7 (ref 5–15)
BILIRUBIN TOTAL: 0.4 mg/dL (ref 0.3–1.2)
BUN: 11 mg/dL (ref 6–20)
CALCIUM: 9.7 mg/dL (ref 8.9–10.3)
CO2: 30 mmol/L (ref 22–32)
CREATININE: 0.69 mg/dL (ref 0.44–1.00)
Chloride: 103 mmol/L (ref 98–111)
GFR calc Af Amer: >60 mL/min (ref >60)
GFR calc non Af Amer: >60 mL/min (ref >60)
GLUCOSE: 89 mg/dL (ref 70–99)
Potassium: 3.8 mmol/L (ref 3.5–5.1)
Sodium: 140 mmol/L (ref 135–145)
TOTAL PROTEIN: 8 g/dL (ref 6.5–8.1)

## 2018-07-29 LAB — POCT PREGNANCY, URINE: PREG TEST UR: NEGATIVE

## 2018-07-29 LAB — CBC
HCT: 39.9 % (ref 36.0–46.0)
Hemoglobin: 12.8 g/dL (ref 12.0–15.0)
MCH: 26.8 pg (ref 26.0–34.0)
MCHC: 32.1 g/dL (ref 30.0–36.0)
MCV: 83.5 fL (ref 80.0–100.0)
PLATELETS: 278 10*3/uL (ref 150–400)
RBC: 4.78 MIL/uL (ref 3.87–5.11)
RDW: 13.2 % (ref 11.5–15.5)
WBC: 5.4 10*3/uL (ref 4.0–10.5)
nRBC: 0 % (ref 0.0–0.2)

## 2018-07-29 LAB — LIPASE, BLOOD: Lipase: 38 U/L (ref 11–51)

## 2018-07-29 MED ORDER — IBUPROFEN 600 MG PO TABS: 600.0000 mg | ORAL_TABLET | Freq: Three times a day (TID) | ORAL | 0 refills | Status: DC | PRN

## 2018-07-29 MED ORDER — AZITHROMYCIN 500 MG PO TABS
1000.0000 mg | ORAL_TABLET | Freq: Once | ORAL | Status: AC
  Administered 2018-07-29: 1000 mg via ORAL
  Filled 2018-07-29: qty 2

## 2018-07-29 MED ORDER — CEFTRIAXONE SODIUM 250 MG IJ SOLR
250.0000 mg | INTRAMUSCULAR | Status: DC
  Administered 2018-07-29: 250 mg via INTRAMUSCULAR
  Filled 2018-07-29: qty 250

## 2018-07-29 NOTE — ED Notes (Signed)
Dr. Williams at the bedside for pt evaluation  

## 2018-07-29 NOTE — ED Provider Notes (Signed)
[facility] Emergency Department Provider Note       Time seen: ----------------------------------------- 8:55 PM on 07/29/2018 -----------------------------------------   I have reviewed the triage vital signs and the nursing notes.  HISTORY   Chief Complaint Abdominal Pain    HPI Catherine Dunn is a 17 y.o. female with no significant past medical history who presents to the ED for this and abdominal pain as well as nausea.  Patient reports symptoms not stopped and they have gotten worse.  She denies fevers, chills or other complaints other than crampy lower abdominal pain.  History reviewed. No pertinent past medical history.  There are no active problems to display for this patient.   Past Surgical History:  Procedure Laterality Date  . WISDOM TOOTH EXTRACTION      Allergies Patient has no known allergies.  Social History Social History   Tobacco Use  . Smoking status: Never Smoker  . Smokeless tobacco: Never Used  Substance Use Topics  . Alcohol use: Yes    Comment: occasionally  . Drug use: No   Review of Systems Constitutional: Negative for fever. Cardiovascular: Negative for chest pain. Respiratory: Negative for shortness of breath. Gastrointestinal: Positive for abdominal pain, nausea Genitourinary: Negative for dysuria. Musculoskeletal: Negative for back pain. Skin: Negative for rash. Neurological: Negative for headaches, focal weakness or numbness.  All systems negative/normal/unremarkable except as stated in the HPI  ____________________________________________   PHYSICAL EXAM:  VITAL SIGNS: ED Triage Vitals  Enc Vitals Group     BP 07/29/18 1932 (!) 141/91     Pulse Rate 07/29/18 1932 (!) 101     Resp 07/29/18 1932 20     Temp 07/29/18 1932 98.4 F (36.9 C)     Temp Source 07/29/18 1932 Oral     SpO2 07/29/18 1932 97 %     Weight 07/29/18 1933 104 lb (47.2 kg)     Height 07/29/18 1933 5\' 1"  (1.549 m)   Head Circumference --      Peak Flow --      Pain Score 07/29/18 1932 5     Pain Loc --      Pain Edu? --      Excl. in GC? --    Constitutional: Alert and oriented. Well appearing and in no distress. Cardiovascular: Normal rate, regular rhythm. No murmurs, rubs, or gallops. Respiratory: Normal respiratory effort without tachypnea nor retractions. Breath sounds are clear and equal bilaterally. No wheezes/rales/rhonchi. Gastrointestinal: Soft and nontender. Normal bowel sounds Musculoskeletal: Nontender with normal range of motion in extremities. No lower extremity tenderness nor edema. Neurologic:  Normal speech and language. No gross focal neurologic deficits are appreciated.  Skin:  Skin is warm, dry and intact. No rash noted. Psychiatric: Mood and affect are normal. Speech and behavior are normal.  ____________________________________________  ED COURSE:  As part of my medical decision making, I reviewed the following data within the electronic MEDICAL RECORD NUMBER History obtained from family if available, nursing notes, old chart and ekg, as well as notes from prior ED visits. Patient presented for persistent abdominal pain, we will assess with labs and imaging as indicated at this time.   Procedures ____________________________________________   LABS (pertinent positives/negatives)  Labs Reviewed  URINALYSIS, COMPLETE (UACMP) WITH MICROSCOPIC - Abnormal; Notable for the following components:      Result Value   Color, Urine YELLOW (*)    APPearance CLEAR (*)    Bacteria, UA RARE (*)    All other components within normal  limits  LIPASE, BLOOD  COMPREHENSIVE METABOLIC PANEL  CBC  POC URINE PREG, ED  POCT PREGNANCY, URINE   ____________________________________________  DIFFERENTIAL DIAGNOSIS   Gas pain, constipation, renal colic, UTI, pyelonephritis, PID  FINAL ASSESSMENT AND PLAN  Abdominal pain   Plan: The patient had presented for persistent abdominal pain.  Patient's labs are reassuring and labs from the last visit were reassuring.  No clear etiology for her symptoms at this time.  On the previous visit she did have vaginal discharge but was negative for gonorrhea and chlamydia.  I will give her Rocephin and Zithromax to cover for same.  She is cleared for outpatient follow-up.   Ulice Dash, MD   Note: This note was generated in part or whole with voice recognition software. Voice recognition is usually quite accurate but there are transcription errors that can and very often do occur. I apologize for any typographical errors that were not detected and corrected.     Emily Filbert, MD 07/29/18 2120

## 2018-07-29 NOTE — ED Triage Notes (Signed)
Pt reports that she was here on 10/30 for abd pain, N/V and reports that it has not stopped that it has gotten worse. Pt NAD, states that she is here all the time. Mucous membranes are moist.

## 2018-08-26 ENCOUNTER — Other Ambulatory Visit: Payer: Self-pay

## 2018-08-26 DIAGNOSIS — Z79899 Other long term (current) drug therapy: Secondary | ICD-10-CM | POA: Insufficient documentation

## 2018-08-26 DIAGNOSIS — M545 Low back pain: Secondary | ICD-10-CM | POA: Insufficient documentation
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
## 2018-08-27 ENCOUNTER — Emergency Department: Payer: Self-pay

## 2018-08-27 ENCOUNTER — Emergency Department
Admission: EM | Admit: 2018-08-27 | Discharge: 2018-08-27 | Disposition: A | Discharge status: Home / Self Care | Payer: Self-pay | Attending: Emergency Medicine | Admitting: Emergency Medicine

## 2018-08-27 DIAGNOSIS — M545 Low back pain, unspecified: Secondary | ICD-10-CM

## 2018-08-27 LAB — POCT PREGNANCY, URINE: Preg Test, Ur: NEGATIVE

## 2018-08-27 MED ORDER — LIDOCAINE 5 % EX PTCH
1.0000 | MEDICATED_PATCH | CUTANEOUS | Status: DC
  Administered 2018-08-27: 1 via TRANSDERMAL
  Filled 2018-08-27: qty 1

## 2018-08-27 MED ORDER — CYCLOBENZAPRINE HCL 10 MG PO TABS: 10.0000 mg | ORAL_TABLET | Freq: Three times a day (TID) | ORAL | 0 refills | Status: DC | PRN

## 2018-08-27 MED ORDER — CYCLOBENZAPRINE HCL 10 MG PO TABS
10.0000 mg | ORAL_TABLET | Freq: Once | ORAL | Status: AC
  Administered 2018-08-27: 10 mg via ORAL
  Filled 2018-08-27: qty 1
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
## 2019-03-20 ENCOUNTER — Encounter: Payer: Self-pay | Admitting: Intensive Care

## 2019-03-20 ENCOUNTER — Emergency Department
Admission: EM | Admit: 2019-03-20 | Discharge: 2019-03-20 | Disposition: A | Discharge status: Home / Self Care | Payer: Self-pay | Attending: Emergency Medicine | Admitting: Emergency Medicine

## 2019-03-20 ENCOUNTER — Other Ambulatory Visit: Payer: Self-pay

## 2019-03-20 DIAGNOSIS — R197 Diarrhea, unspecified: Secondary | ICD-10-CM | POA: Insufficient documentation

## 2019-03-20 DIAGNOSIS — R509 Fever, unspecified: Secondary | ICD-10-CM | POA: Insufficient documentation

## 2019-03-20 DIAGNOSIS — R51 Headache: Secondary | ICD-10-CM | POA: Insufficient documentation

## 2019-03-20 DIAGNOSIS — B349 Viral infection, unspecified: Secondary | ICD-10-CM | POA: Insufficient documentation

## 2019-03-20 HISTORY — DX: COVID-19: U07.1

## 2019-03-20 HISTORY — DX: Noninfective gastroenteritis and colitis, unspecified: K52.9

## 2019-03-20 LAB — COMPREHENSIVE METABOLIC PANEL
ALT: 12 U/L (ref 0–44)
AST: 17 U/L (ref 15–41)
Albumin: 4.2 g/dL (ref 3.5–5.0)
Alkaline Phosphatase: 61 U/L (ref 38–126)
Anion gap: 10 (ref 5–15)
BUN: 12 mg/dL (ref 6–20)
CO2: 25 mmol/L (ref 22–32)
Calcium: 9.7 mg/dL (ref 8.9–10.3)
Chloride: 103 mmol/L (ref 98–111)
Creatinine, Ser: 0.61 mg/dL (ref 0.44–1.00)
GFR calc Af Amer: >60 mL/min (ref >60)
GFR calc non Af Amer: >60 mL/min (ref >60)
Glucose, Bld: 97 mg/dL (ref 70–99)
Potassium: 3.9 mmol/L (ref 3.5–5.1)
Sodium: 138 mmol/L (ref 135–145)
Total Bilirubin: 0.5 mg/dL (ref 0.3–1.2)
Total Protein: 8 g/dL (ref 6.5–8.1)

## 2019-03-20 LAB — URINALYSIS, COMPLETE (UACMP) WITH MICROSCOPIC
Bacteria, UA: NONE SEEN
Bilirubin Urine: NEGATIVE
Glucose, UA: NEGATIVE mg/dL
Hgb urine dipstick: NEGATIVE
Ketones, ur: NEGATIVE mg/dL
Leukocytes,Ua: NEGATIVE
Nitrite: NEGATIVE
Protein, ur: NEGATIVE mg/dL
Specific Gravity, Urine: 1.024 (ref 1.005–1.030)
pH: 8 (ref 5.0–8.0)

## 2019-03-20 LAB — CBC
HCT: 39.6 % (ref 36.0–46.0)
Hemoglobin: 12.7 g/dL (ref 12.0–15.0)
MCH: 27.1 pg (ref 26.0–34.0)
MCHC: 32.1 g/dL (ref 30.0–36.0)
MCV: 84.4 fL (ref 80.0–100.0)
Platelets: 311 10*3/uL (ref 150–400)
RBC: 4.69 MIL/uL (ref 3.87–5.11)
RDW: 12.9 % (ref 11.5–15.5)
WBC: 12.3 10*3/uL — ABNORMAL HIGH (ref 4.0–10.5)
nRBC: 0 % (ref 0.0–0.2)

## 2019-03-20 LAB — POCT PREGNANCY, URINE: Preg Test, Ur: NEGATIVE

## 2019-03-20 LAB — LIPASE, BLOOD: Lipase: 32 U/L (ref 11–51)

## 2019-03-20 MED ORDER — ONDANSETRON HCL 4 MG/2ML IJ SOLN
4.0000 mg | Freq: Once | INTRAMUSCULAR | Status: AC
  Administered 2019-03-20: 4 mg via INTRAVENOUS
  Filled 2019-03-20: qty 2

## 2019-03-20 MED ORDER — SODIUM CHLORIDE 0.9 % IV BOLUS
1000.0000 mL | Freq: Once | INTRAVENOUS | Status: AC
  Administered 2019-03-20: 20:00:00 1000 mL via INTRAVENOUS

## 2019-03-20 MED ORDER — ONDANSETRON 4 MG PO TBDP: 4.0000 mg | ORAL_TABLET | Freq: Three times a day (TID) | ORAL | 0 refills | Status: DC | PRN

## 2019-03-20 NOTE — ED Provider Notes (Signed)
[facility] Emergency Department Provider Note  ___________________________________________   None    (approximate)  I have reviewed the triage vital signs and the nursing notes.   HISTORY  Chief Complaint Nausea, Diarrhea, Emesis, and Headache  HPI Catherine Dunn is a 18 y.o. female who presents to the emergency department for treatment and evaluation of nausea, vomiting, diarrhea, and headache with low-grade fever in the setting of COVID 19.  She did test positive in May.  She had gotten some better until about a week ago and then began to feel bad again.  No alleviating measures attempted.  She has been able to drink ginger ale this afternoon without vomiting.     Past Medical History:  Diagnosis Date  . COVID-19 virus infection   . Gastroenteritis     There are no active problems to display for this patient.   Past Surgical History:  Procedure Laterality Date  . WISDOM TOOTH EXTRACTION      Prior to Admission medications   Medication Sig Start Date End Date Taking? Authorizing Provider  amoxicillin (AMOXIL) 875 MG tablet Take 1 tablet (875 mg total) by mouth 2 (two) times daily. 01/27/18   Marylene Land, NP  cyclobenzaprine (FLEXERIL) 10 MG tablet Take 1 tablet (10 mg total) by mouth 3 (three) times daily as needed. 08/27/18   Gregor Hams, MD  doxycycline (VIBRAMYCIN) 100 MG capsule Take 1 capsule (100 mg total) by mouth 2 (two) times daily. 04/04/18   Lorin Picket, PA-C  ibuprofen (ADVIL,MOTRIN) 600 MG tablet Take 1 tablet (600 mg total) by mouth every 8 (eight) hours as needed. 07/29/18   Earleen Newport, MD  norgestimate-ethinyl estradiol (ORTHO-CYCLEN,SPRINTEC,PREVIFEM) 0.25-35 MG-MCG tablet Take 1 tablet by mouth daily.    [provider]  ondansetron (ZOFRAN-ODT) 4 MG disintegrating tablet Take 1 tablet (4 mg total) by mouth every 8 (eight) hours as needed for nausea or vomiting. 03/20/19   Sherrie George B, FNP     Allergies Patient has no known allergies.  Family History  Problem Relation Age of Onset  . COPD Mother   . Heart attack Mother   . Diverticulitis Mother   . Hypertension Mother   . Ovarian cancer Mother     Social History Social History   Tobacco Use  . Smoking status: Never Smoker  . Smokeless tobacco: Never Used  Substance Use Topics  . Alcohol use: Yes    Comment: occasionally  . Drug use: No    Review of Systems  Constitutional: Positive for fever and chills Eyes: No visual changes. ENT: No sore throat. Cardiovascular: Denies chest pain. Respiratory: Denies shortness of breath. Gastrointestinal: No abdominal pain.  Positive for nausea, vomiting, and diarrhea genitourinary: Negative for dysuria. Musculoskeletal: Negative for back pain. Skin: Negative for rash. Neurological: Positive for headache, negative for weakness or numbness. ___________________________________________   PHYSICAL EXAM:  VITAL SIGNS: ED Triage Vitals  Enc Vitals Group     BP 03/20/19 1617 (!) 143/90     Pulse Rate 03/20/19 1617 (!) 111     Resp 03/20/19 1617 16     Temp 03/20/19 1617 99.1 F (37.3 C)     Temp Source 03/20/19 1617 Oral     SpO2 03/20/19 1617 98 %     Weight 03/20/19 1618 122 lb (55.3 kg)     Height 03/20/19 1618 5\' 1"  (1.549 m)     Head Circumference --      Peak Flow --  Pain Score 03/20/19 1617 5     Pain Loc --      Pain Edu? --      Excl. in GC? --     Constitutional: Alert and oriented. Well appearing and in no acute distress. Eyes: Conjunctivae are normal. PERRL. Head: Atraumatic. Nose: No congestion/rhinnorhea. Mouth/Throat: Mucous membranes are moist.  Oropharynx non-erythematous. Neck: No stridor.   Cardiovascular: Normal rate, regular rhythm. Grossly normal heart sounds.  Good peripheral circulation. Respiratory: Normal respiratory effort.  No retractions. Lungs CTAB. Gastrointestinal: Soft and nontender. No distention. No abdominal  bruits. No CVA tenderness. Musculoskeletal: No lower extremity tenderness nor edema.  No joint effusions. Neurologic:  Normal speech and language. No gross focal neurologic deficits are appreciated. No gait instability. Skin:  Skin is warm, dry and intact. No rash noted. Psychiatric: Mood and affect are normal. Speech and behavior are normal.  ____________________________________________   LABS (all labs ordered are listed, but only abnormal results are displayed)  Labs Reviewed  CBC - Abnormal; Notable for the following components:      Result Value   WBC 12.3 (*)    All other components within normal limits  URINALYSIS, COMPLETE (UACMP) WITH MICROSCOPIC - Abnormal; Notable for the following components:   Color, Urine YELLOW (*)    APPearance HAZY (*)    All other components within normal limits  LIPASE, BLOOD  COMPREHENSIVE METABOLIC PANEL  POC URINE PREG, ED  POCT PREGNANCY, URINE   ____________________________________________  EKG  Not indicated ____________________________________________  RADIOLOGY  ED MD interpretation: Not indicated  Official radiology report(s): No results found.  ____________________________________________   PROCEDURES  Procedure(s) performed: None  Procedures  Critical Care performed: No  ____________________________________________   INITIAL IMPRESSION / ASSESSMENT AND PLAN / ED COURSE    18 year old female presenting to the emergency department for treatment and evaluation of nausea, vomiting, diarrhea, and headache.  She has tested positive for COVID-19.  She states that she has been unable to keep any solid food down in the past 2 days.  She has however tolerated ginger ale without vomiting today.  While here, she received IV fluids and Zofran with significant improvement.  Headache near completely resolved.  No active vomiting.  She will be discharged home and given quarantine instructions.  She will also be provided with a  work excuse.  Patient was advised to return to the emergency department if she begins to experience any shortness of breath, persistent vomiting or diarrhea.      ____________________________________________   FINAL CLINICAL IMPRESSION(S) / ED DIAGNOSES  Final diagnoses:  Viral syndrome     ED Discharge Orders         Ordered    ondansetron (ZOFRAN-ODT) 4 MG disintegrating tablet  Every 8 hours PRN     03/20/19 2058           Note:  This document was prepared using Dragon voice recognition software and may include unintentional dictation errors.    Chinita Pesterriplett, Yerlin Gasparyan B, FNP 03/20/19 2108    Jeanmarie PlantMcShane, James A, MD 03/20/19 2122

## 2019-03-20 NOTE — Discharge Instructions (Signed)
Please schedule follow-up appointment with primary care if you are not feeling better within the next few days.  Do not work until you have been symptom-free for at least 3 days.  Return to the emergency department for symptoms of change or worsen if you are unable to schedule appointment.

## 2019-03-20 NOTE — ED Triage Notes (Signed)
Patient c/o N/V/D and headache X1 week. Patient tested positive on Jan 27, 2019 for COVID and was cleared to go back to work May 14th.

## 2019-08-26 ENCOUNTER — Encounter: Payer: Self-pay | Admitting: Emergency Medicine

## 2019-08-26 ENCOUNTER — Other Ambulatory Visit: Payer: Self-pay

## 2019-08-26 DIAGNOSIS — Z793 Long term (current) use of hormonal contraceptives: Secondary | ICD-10-CM | POA: Insufficient documentation

## 2019-08-26 DIAGNOSIS — R5383 Other fatigue: Secondary | ICD-10-CM | POA: Insufficient documentation

## 2019-08-26 DIAGNOSIS — R109 Unspecified abdominal pain: Secondary | ICD-10-CM | POA: Diagnosis present

## 2019-08-26 DIAGNOSIS — Z79899 Other long term (current) drug therapy: Secondary | ICD-10-CM | POA: Insufficient documentation

## 2019-08-26 DIAGNOSIS — R110 Nausea: Secondary | ICD-10-CM | POA: Insufficient documentation

## 2019-08-26 LAB — COMPREHENSIVE METABOLIC PANEL
ALT: 12 U/L (ref 0–44)
AST: 20 U/L (ref 15–41)
Albumin: 4.7 g/dL (ref 3.5–5.0)
Alkaline Phosphatase: 71 U/L (ref 38–126)
Anion gap: 10 (ref 5–15)
BUN: 9 mg/dL (ref 6–20)
CO2: 27 mmol/L (ref 22–32)
Calcium: 9.6 mg/dL (ref 8.9–10.3)
Chloride: 102 mmol/L (ref 98–111)
Creatinine, Ser: 0.68 mg/dL (ref 0.44–1.00)
GFR calc Af Amer: >60 mL/min (ref >60)
GFR calc non Af Amer: >60 mL/min (ref >60)
Glucose, Bld: 109 mg/dL — ABNORMAL HIGH (ref 70–99)
Potassium: 3.6 mmol/L (ref 3.5–5.1)
Sodium: 139 mmol/L (ref 135–145)
Total Bilirubin: 0.7 mg/dL (ref 0.3–1.2)
Total Protein: 8.5 g/dL — ABNORMAL HIGH (ref 6.5–8.1)

## 2019-08-26 LAB — URINALYSIS, COMPLETE (UACMP) WITH MICROSCOPIC
Bilirubin Urine: NEGATIVE
Glucose, UA: NEGATIVE mg/dL
Hgb urine dipstick: NEGATIVE
Ketones, ur: NEGATIVE mg/dL
Leukocytes,Ua: NEGATIVE
Nitrite: NEGATIVE
Protein, ur: NEGATIVE mg/dL
Specific Gravity, Urine: 1.018 (ref 1.005–1.030)
pH: 6 (ref 5.0–8.0)

## 2019-08-26 LAB — CBC
HCT: 41.8 % (ref 36.0–46.0)
Hemoglobin: 13.9 g/dL (ref 12.0–15.0)
MCH: 27.1 pg (ref 26.0–34.0)
MCHC: 33.3 g/dL (ref 30.0–36.0)
MCV: 81.6 fL (ref 80.0–100.0)
Platelets: 292 10*3/uL (ref 150–400)
RBC: 5.12 MIL/uL — ABNORMAL HIGH (ref 3.87–5.11)
RDW: 13.2 % (ref 11.5–15.5)
WBC: 5.2 10*3/uL (ref 4.0–10.5)
nRBC: 0 % (ref 0.0–0.2)

## 2019-08-26 LAB — LIPASE, BLOOD: Lipase: 32 U/L (ref 11–51)

## 2019-08-26 LAB — POCT PREGNANCY, URINE: Preg Test, Ur: NEGATIVE

## 2019-08-26 NOTE — ED Triage Notes (Addendum)
Pt arrived via POV with reports of being sent home from work today because of sxs of increased tiredness and nausea and headache.  Pt had negative COVID test on 11/20.  Pt reports having COVID May 4 of this year.

## 2019-08-27 ENCOUNTER — Emergency Department
Admission: EM | Admit: 2019-08-27 | Discharge: 2019-08-27 | Disposition: A | Discharge status: Home / Self Care | Payer: Medicaid Other | Attending: Emergency Medicine | Admitting: Emergency Medicine

## 2019-08-27 DIAGNOSIS — R109 Unspecified abdominal pain: Secondary | ICD-10-CM

## 2019-08-27 DIAGNOSIS — R5383 Other fatigue: Secondary | ICD-10-CM

## 2019-08-27 DIAGNOSIS — R110 Nausea: Secondary | ICD-10-CM

## 2019-08-27 MED ORDER — DICYCLOMINE HCL 20 MG PO TABS: 20.0000 mg | ORAL_TABLET | Freq: Three times a day (TID) | ORAL | 0 refills | Status: DC | PRN

## 2019-08-27 NOTE — Discharge Instructions (Signed)
Please seek medical attention for any high fevers, chest pain, shortness of breath, change in behavior, persistent vomiting, bloody stool or any other new or concerning symptoms.  

## 2019-08-27 NOTE — ED Provider Notes (Signed)
[facility] Emergency Department Provider Note  ____________________________________________   I have reviewed the triage vital signs and the nursing notes.   HISTORY  Chief Complaint Abdominal pain, fatigue  History limited by: Not Limited   HPI Catherine Dunn is a 18 y.o. female who presents to the emergency department today because of concern for fatigue, abdominal pain and nausea. The patient states that the symptoms have been going on for two days. The abdominal pain was the first symptom. Located in the right side. Has been accompanied by nausea and decreased oral intake. The patient states that she gets this abdominal pain with some regularity. Has been seen in EDs for it in the past without any clear etiology. She has had associated headache and fatigue. Went to work today but left because of the fatigue, employer asked she be seen by a medical provider. The patient denies any fevers. Denies any known sick contacts.   Records reviewed. Per medical record review patient has a history of gastroenteritis.   Past Medical History:  Diagnosis Date  . COVID-19 virus infection   . Gastroenteritis     There are no active problems to display for this patient.   Past Surgical History:  Procedure Laterality Date  . WISDOM TOOTH EXTRACTION      Prior to Admission medications   Medication Sig Start Date End Date Taking? Authorizing Provider  amoxicillin (AMOXIL) 875 MG tablet Take 1 tablet (875 mg total) by mouth 2 (two) times daily. 01/27/18   Marylene Land, NP  cyclobenzaprine (FLEXERIL) 10 MG tablet Take 1 tablet (10 mg total) by mouth 3 (three) times daily as needed. 08/27/18   Gregor Hams, MD  doxycycline (VIBRAMYCIN) 100 MG capsule Take 1 capsule (100 mg total) by mouth 2 (two) times daily. 04/04/18   Lorin Picket, PA-C  ibuprofen (ADVIL,MOTRIN) 600 MG tablet Take 1 tablet (600 mg total) by mouth every 8 (eight) hours as needed. 07/29/18    Earleen Newport, MD  norgestimate-ethinyl estradiol (ORTHO-CYCLEN,SPRINTEC,PREVIFEM) 0.25-35 MG-MCG tablet Take 1 tablet by mouth daily.    [provider]  ondansetron (ZOFRAN-ODT) 4 MG disintegrating tablet Take 1 tablet (4 mg total) by mouth every 8 (eight) hours as needed for nausea or vomiting. 03/20/19   Sherrie George B, FNP    Allergies Patient has no known allergies.  Family History  Problem Relation Age of Onset  . COPD Mother   . Heart attack Mother   . Diverticulitis Mother   . Hypertension Mother   . Ovarian cancer Mother     Social History Social History   Tobacco Use  . Smoking status: Never Smoker  . Smokeless tobacco: Never Used  Substance Use Topics  . Alcohol use: Yes    Comment: occasionally  . Drug use: No    Review of Systems Constitutional: No fever/chills. Positive for fatigue.  Eyes: No visual changes. ENT: No sore throat. Cardiovascular: Denies chest pain. Respiratory: Denies shortness of breath. Gastrointestinal: Positive for abdominal pain, nausea Genitourinary: Negative for dysuria. Musculoskeletal: Negative for back pain. Skin: Negative for rash. Neurological: Positive for headache.  ____________________________________________   PHYSICAL EXAM:  VITAL SIGNS: ED Triage Vitals  Enc Vitals Group     BP 08/26/19 1954 (!) 129/95     Pulse Rate 08/26/19 1954 (!) 102     Resp 08/26/19 1954 18     Temp 08/26/19 1954 98.6 F (37 C)     Temp Source 08/26/19 1954 Oral  SpO2 08/26/19 1954 100 %     Weight 08/26/19 1951 120 lb (54.4 kg)     Height 08/26/19 1951 5\' 1"  (1.549 m)     Head Circumference --      Peak Flow --      Pain Score 08/26/19 1950 5   Constitutional: Alert and oriented.  Eyes: Conjunctivae are normal.  ENT      Head: Normocephalic and atraumatic.      Nose: No congestion/rhinnorhea.      Mouth/Throat: Mucous membranes are moist.      Neck: No stridor. Hematological/Lymphatic/Immunilogical: No  cervical lymphadenopathy. Cardiovascular: Normal rate, regular rhythm.  No murmurs, rubs, or gallops.  Respiratory: Normal respiratory effort without tachypnea nor retractions. Breath sounds are clear and equal bilaterally. No wheezes/rales/rhonchi. Gastrointestinal: Soft and non tender. No rebound. No guarding.  Genitourinary: Deferred Musculoskeletal: Normal range of motion in all extremities. No lower extremity edema. Neurologic:  Normal speech and language. No gross focal neurologic deficits are appreciated.  Skin:  Skin is warm, dry and intact. No rash noted. Psychiatric: Mood and affect are normal. Speech and behavior are normal. Patient exhibits appropriate insight and judgment.  ____________________________________________    LABS (pertinent positives/negatives)  Upreg negative Lipase 32 CMP wnl except glu 109, t pro 8.5 CBC wbc 5.2, hgb 13.9, plt 292 UA hazy, rbc and wbc 0-5  ____________________________________________   EKG  None  ____________________________________________    RADIOLOGY  None  ____________________________________________   PROCEDURES  Procedures  ____________________________________________   INITIAL IMPRESSION / ASSESSMENT AND PLAN / ED COURSE  Pertinent labs & imaging results that were available during my care of the patient were reviewed by me and considered in my medical decision making (see chart for details).   Patient presented to the emergency department today because of concern with primary complaint of abdominal pain. Has had similar pain in the past. No fever or leukocytosis. Abdomin is non tender. At this time I doubt significant intraabdominal infection. UA without concerning findings. At this time do not feel any emergent imaging is necessary. Do wonder about IBS type pathology given that the abdominal pain has been a recurrent issue for the patient. Discussed this with the patient. Also discussed return precautions. Will  give patient PCP information.   ____________________________________________   FINAL CLINICAL IMPRESSION(S) / ED DIAGNOSES  Final diagnoses:  Abdominal pain, unspecified abdominal location  Nausea  Other fatigue     Note: This dictation was prepared with Dragon dictation. Any transcriptional errors that result from this process are unintentional     14/01/20, MD 08/27/19 0111

## 2019-10-25 IMAGING — DX DG WRIST COMPLETE 3+V*R*
4 series · 4 of 4 positions shown · non-contrast
Comparison: None.

CLINICAL DATA: Right wrist laceration

EXAM:
RIGHT WRIST - COMPLETE 3+ VIEW

[wrist ap (1 of 2)]
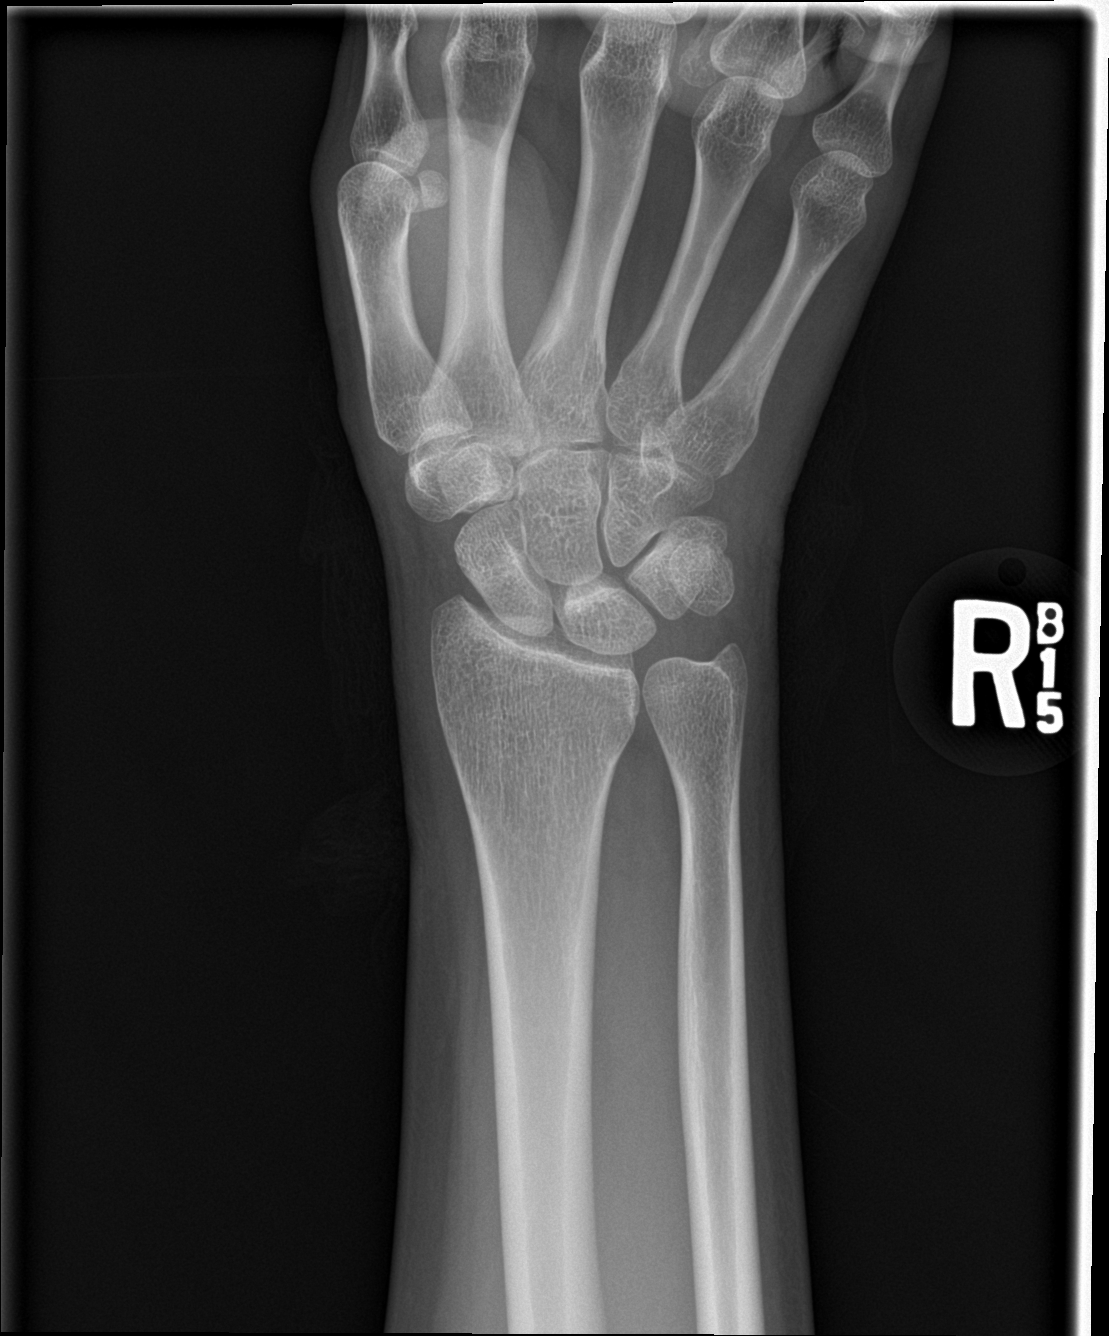

[wrist obl]
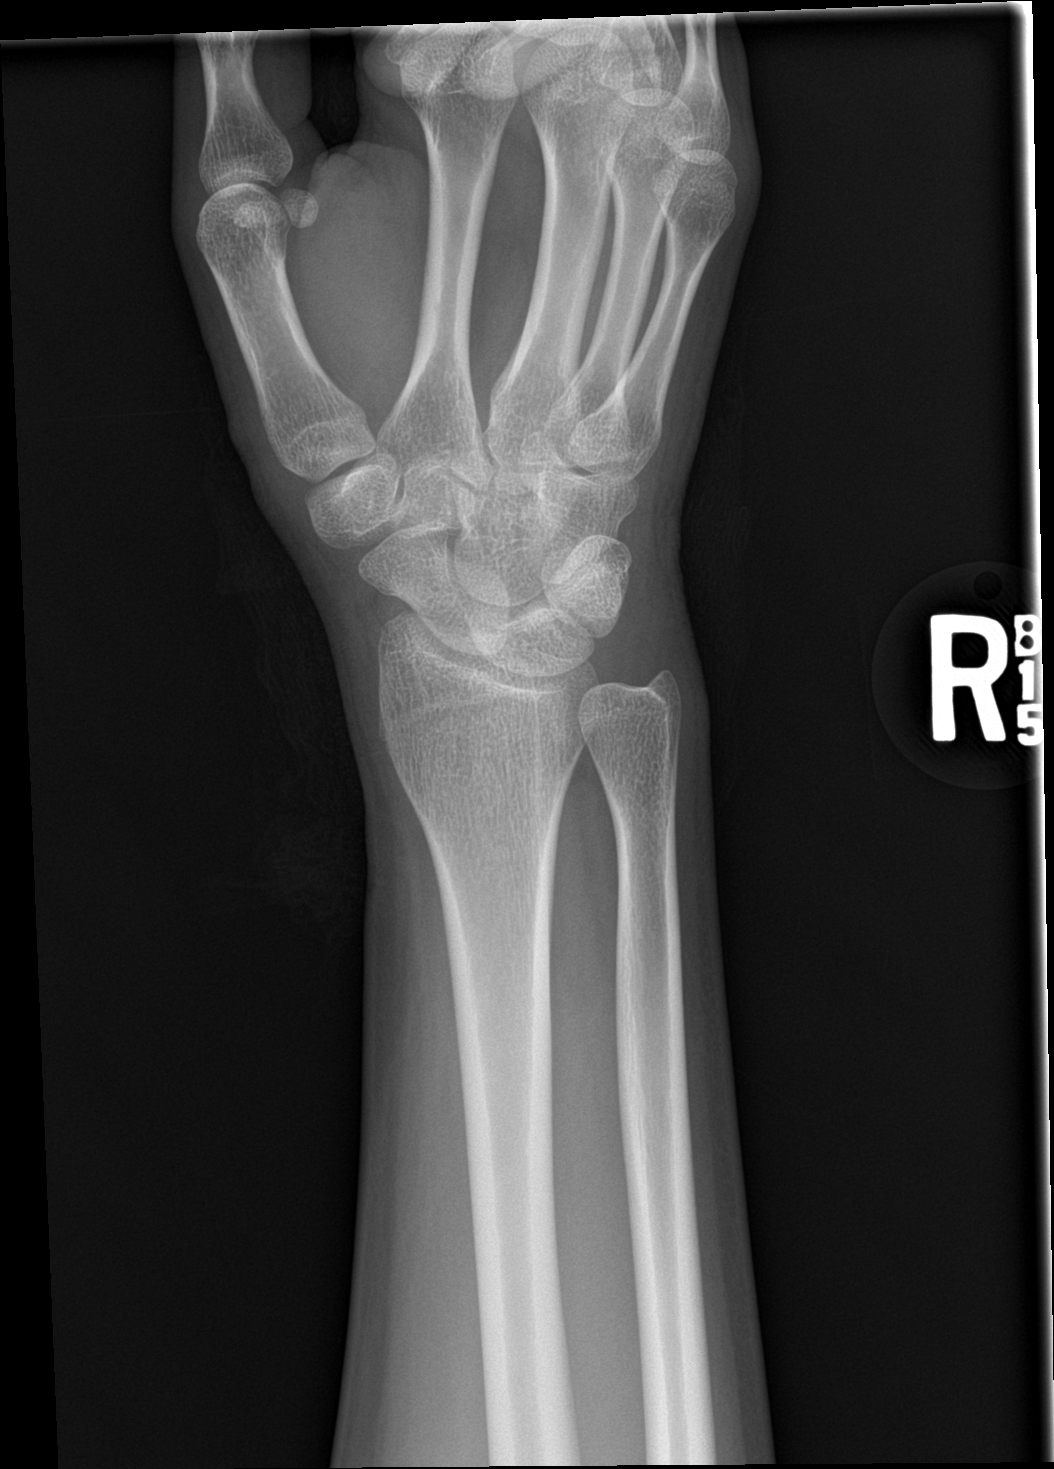

[wrist lat]
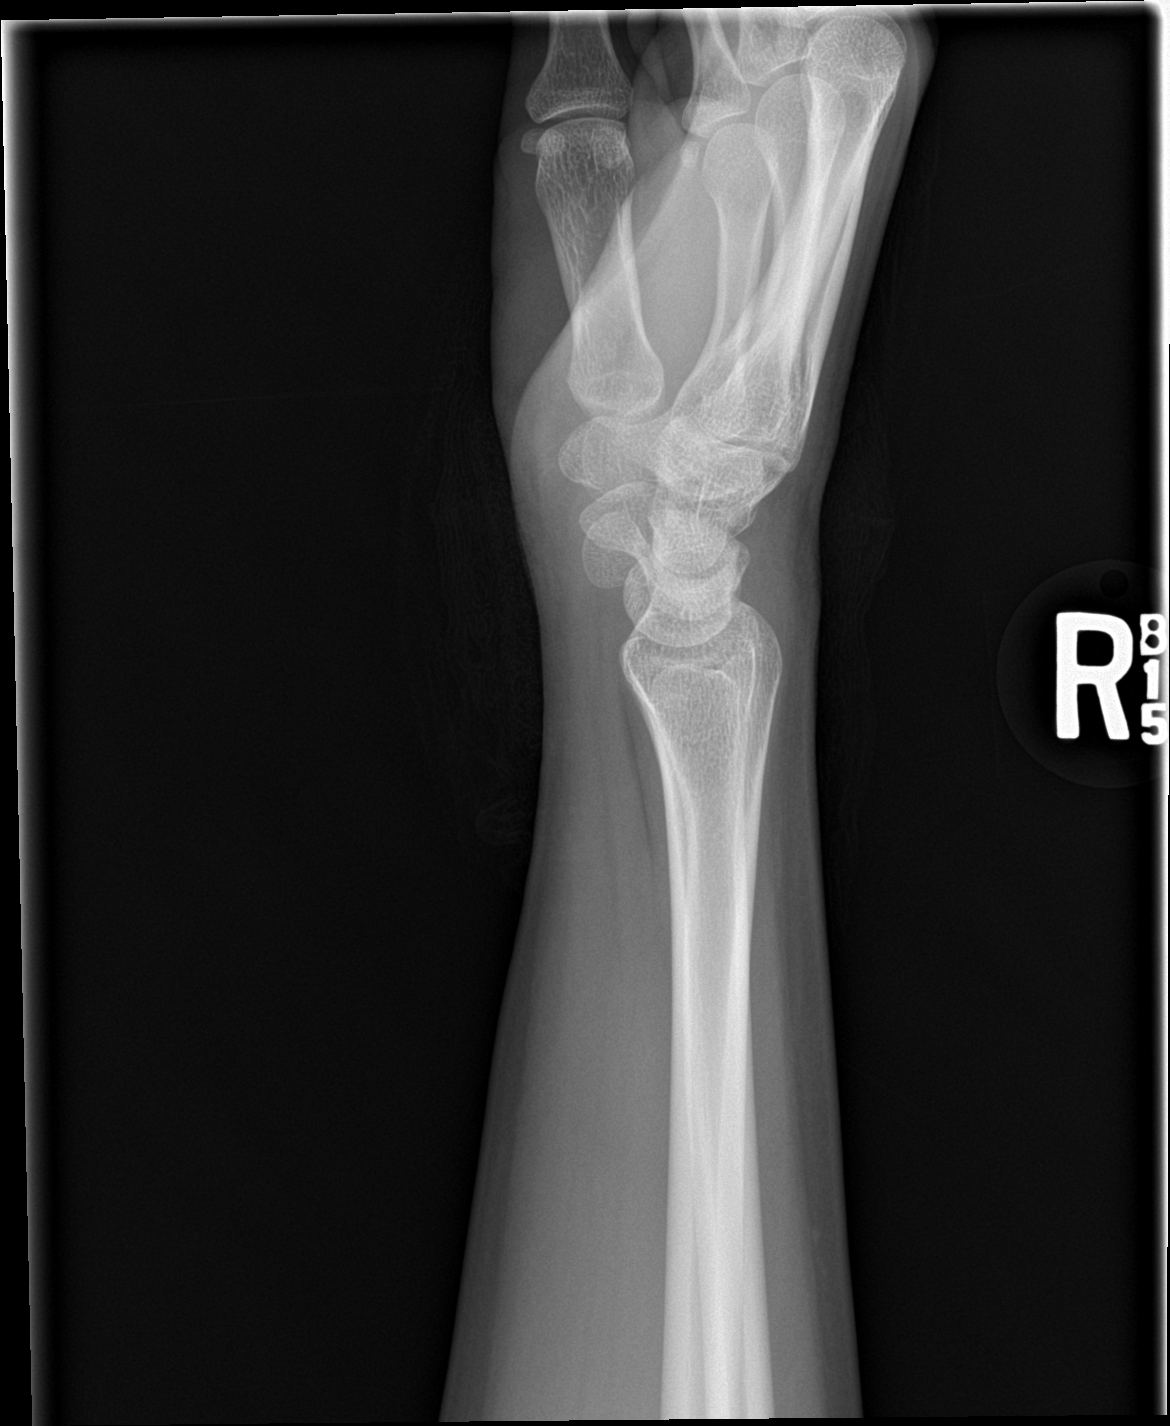

[wrist ap (2 of 2)]
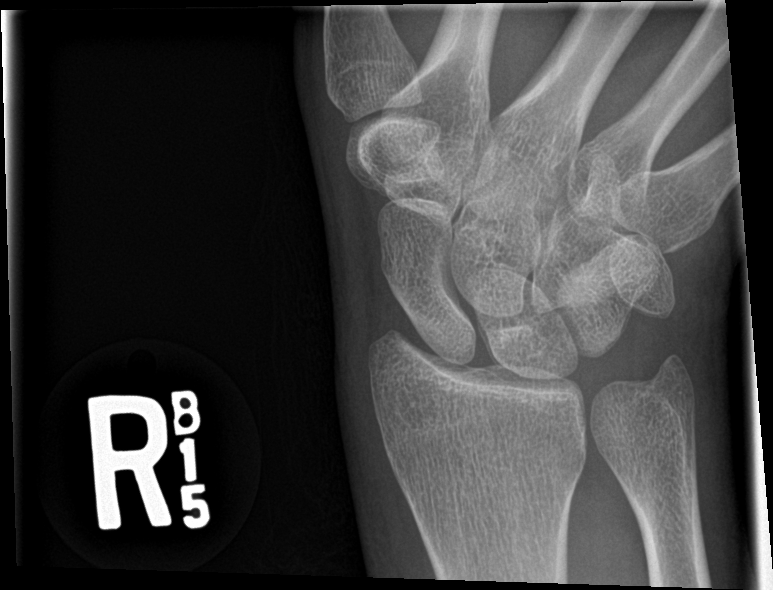

[4 of 4 positions shown; findings below may reference images not displayed]

FINDINGS: There is no evidence of fracture or dislocation. There is no
evidence of arthropathy or other focal bone abnormality. Soft
tissues are unremarkable.
IMPRESSION: No acute osseous injury of the right wrist.

## 2019-11-03 ENCOUNTER — Other Ambulatory Visit: Payer: Self-pay

## 2019-11-03 ENCOUNTER — Ambulatory Visit (LOCAL_COMMUNITY_HEALTH_CENTER): Payer: Medicaid Other

## 2019-11-03 VITALS — BP 136/96 | Ht 61.0 in | Wt 117.5 lb

## 2019-11-03 DIAGNOSIS — Z3201 Encounter for pregnancy test, result positive: Secondary | ICD-10-CM

## 2019-11-03 LAB — PREGNANCY, URINE: Preg Test, Ur: POSITIVE — AB

## 2019-11-04 MED ORDER — PRENATAL VITAMINS 28-0.8 MG PO TABS: 28.0000 mg | ORAL_TABLET | Freq: Every day | ORAL | 0 refills | Status: AC

## 2019-11-13 ENCOUNTER — Other Ambulatory Visit: Payer: Self-pay

## 2019-11-13 ENCOUNTER — Emergency Department: Payer: Medicaid Other

## 2019-11-13 ENCOUNTER — Encounter: Payer: Self-pay | Admitting: Emergency Medicine

## 2019-11-13 ENCOUNTER — Emergency Department
Admission: EM | Admit: 2019-11-13 | Discharge: 2019-11-13 | Disposition: A | Discharge status: Home / Self Care | Payer: Medicaid Other | Attending: Emergency Medicine | Admitting: Emergency Medicine

## 2019-11-13 DIAGNOSIS — Z8616 Personal history of COVID-19: Secondary | ICD-10-CM | POA: Insufficient documentation

## 2019-11-13 DIAGNOSIS — Z3491 Encounter for supervision of normal pregnancy, unspecified, first trimester: Secondary | ICD-10-CM

## 2019-11-13 DIAGNOSIS — O26899 Other specified pregnancy related conditions, unspecified trimester: Secondary | ICD-10-CM

## 2019-11-13 DIAGNOSIS — O99891 Other specified diseases and conditions complicating pregnancy: Secondary | ICD-10-CM | POA: Diagnosis present

## 2019-11-13 DIAGNOSIS — Z3A01 Less than 8 weeks gestation of pregnancy: Secondary | ICD-10-CM | POA: Diagnosis not present

## 2019-11-13 DIAGNOSIS — F17290 Nicotine dependence, other tobacco product, uncomplicated: Secondary | ICD-10-CM | POA: Diagnosis not present

## 2019-11-13 LAB — URINALYSIS, COMPLETE (UACMP) WITH MICROSCOPIC
Bacteria, UA: NONE SEEN
Bilirubin Urine: NEGATIVE
Glucose, UA: NEGATIVE mg/dL
Hgb urine dipstick: NEGATIVE
Ketones, ur: 5 mg/dL — AB
Leukocytes,Ua: NEGATIVE
Nitrite: NEGATIVE
Protein, ur: NEGATIVE mg/dL
Specific Gravity, Urine: 1.004 — ABNORMAL LOW (ref 1.005–1.030)
pH: 7 (ref 5.0–8.0)

## 2019-11-13 LAB — COMPREHENSIVE METABOLIC PANEL
ALT: 10 U/L (ref 0–44)
AST: 14 U/L — ABNORMAL LOW (ref 15–41)
Albumin: 4.2 g/dL (ref 3.5–5.0)
Alkaline Phosphatase: 54 U/L (ref 38–126)
Anion gap: 10 (ref 5–15)
BUN: 8 mg/dL (ref 6–20)
CO2: 25 mmol/L (ref 22–32)
Calcium: 9.5 mg/dL (ref 8.9–10.3)
Chloride: 101 mmol/L (ref 98–111)
Creatinine, Ser: 0.56 mg/dL (ref 0.44–1.00)
GFR calc Af Amer: >60 mL/min (ref >60)
GFR calc non Af Amer: >60 mL/min (ref >60)
Glucose, Bld: 89 mg/dL (ref 70–99)
Potassium: 3.4 mmol/L — ABNORMAL LOW (ref 3.5–5.1)
Sodium: 136 mmol/L (ref 135–145)
Total Bilirubin: 0.8 mg/dL (ref 0.3–1.2)
Total Protein: 7.6 g/dL (ref 6.5–8.1)

## 2019-11-13 LAB — CBC
HCT: 38.2 % (ref 36.0–46.0)
Hemoglobin: 12.9 g/dL (ref 12.0–15.0)
MCH: 28.5 pg (ref 26.0–34.0)
MCHC: 33.8 g/dL (ref 30.0–36.0)
MCV: 84.3 fL (ref 80.0–100.0)
Platelets: 238 10*3/uL (ref 150–400)
RBC: 4.53 MIL/uL (ref 3.87–5.11)
RDW: 13.3 % (ref 11.5–15.5)
WBC: 6.5 10*3/uL (ref 4.0–10.5)
nRBC: 0 % (ref 0.0–0.2)

## 2019-11-13 LAB — LIPASE, BLOOD: Lipase: 25 U/L (ref 11–51)

## 2019-11-13 LAB — HCG, QUANTITATIVE, PREGNANCY: hCG, Beta Chain, Quant, S: 54878 m[IU]/mL — ABNORMAL HIGH (ref <5)

## 2019-11-13 MED ORDER — SODIUM CHLORIDE 0.9% FLUSH: 3.0000 mL | Freq: Once | INTRAVENOUS | Status: DC
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
## 2019-11-13 NOTE — ED Notes (Signed)
Patient transported to ultrasound.
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
## 2019-11-21 ENCOUNTER — Encounter: Payer: Self-pay | Admitting: Advanced Practice Midwife

## 2019-11-21 ENCOUNTER — Ambulatory Visit (INDEPENDENT_AMBULATORY_CARE_PROVIDER_SITE_OTHER): Payer: Medicaid Other | Admitting: Advanced Practice Midwife

## 2019-11-21 ENCOUNTER — Other Ambulatory Visit (HOSPITAL_COMMUNITY)
Admission: RE | Admit: 2019-11-21 | Discharge: 2019-11-21 | Disposition: A | Discharge status: Home / Self Care | Payer: Medicaid Other | Source: Ambulatory Visit | Attending: Advanced Practice Midwife | Admitting: Advanced Practice Midwife

## 2019-11-21 ENCOUNTER — Other Ambulatory Visit: Payer: Self-pay

## 2019-11-21 VITALS — BP 118/74 | Wt 118.0 lb

## 2019-11-21 DIAGNOSIS — Z3401 Encounter for supervision of normal first pregnancy, first trimester: Secondary | ICD-10-CM | POA: Diagnosis not present

## 2019-11-21 DIAGNOSIS — Z3400 Encounter for supervision of normal first pregnancy, unspecified trimester: Secondary | ICD-10-CM | POA: Insufficient documentation

## 2019-11-21 DIAGNOSIS — Z113 Encounter for screening for infections with a predominantly sexual mode of transmission: Secondary | ICD-10-CM | POA: Insufficient documentation

## 2019-11-21 DIAGNOSIS — Z3A09 9 weeks gestation of pregnancy: Secondary | ICD-10-CM

## 2019-11-21 NOTE — Patient Instructions (Signed)
Exercise During Pregnancy Exercise is an important part of being healthy for people of all ages. Exercise improves the function of your heart and lungs and helps you maintain strength, flexibility, and a healthy body weight. Exercise also boosts energy levels and elevates mood. Most women should exercise regularly during pregnancy. In rare cases, women with certain medical conditions or complications may be asked to limit or avoid exercise during pregnancy. How does this affect me? Along with maintaining general strength and flexibility, exercising during pregnancy can help:  Keep strength in muscles that are used during labor and childbirth.  Decrease low back pain.  Reduce symptoms of depression.  Control weight gain during pregnancy.  Reduce the risk of needing insulin if you develop diabetes during pregnancy.  Decrease the risk of cesarean delivery.  Speed up your recovery after giving birth. How does this affect my baby? Exercise can help you have a healthy pregnancy. Exercise does not cause premature birth. It will not cause your baby to weigh less at birth. What exercises can I do? Many exercises are safe for you to do during pregnancy. Do a variety of exercises that safely increase your heart and breathing rates and help you build and maintain muscle strength. Do exercises exactly as told by your health care provider. You may do these exercises:  Walking or hiking.  Swimming.  Water aerobics.  Riding a stationary bike.  Strength training.  Modified yoga or Pilates. Tell your instructor that you are pregnant. Avoid overstretching, and avoid lying on your back for long periods of time.  Running or jogging. Only choose this type of exercise if you: ? Ran or jogged regularly before your pregnancy. ? Can run or jog and still talk in complete sentences. What exercises should I avoid? Depending on your level of fitness and whether you exercised regularly before your  pregnancy, you may be told to limit high-intensity exercise. You can tell that you are exercising at a high intensity if you are breathing much harder and faster and cannot hold a conversation while exercising. You must avoid:  Contact sports.  Activities that put you at risk for falling on or being hit in the belly, such as downhill skiing, water skiing, surfing, rock climbing, cycling, gymnastics, and horseback riding.  Scuba diving.  Skydiving.  Yoga or Pilates in a room that is heated to high temperatures.  Jogging or running, unless you ran or jogged regularly before your pregnancy. While jogging or running, you should always be able to talk in full sentences. Do not run or jog so fast that you are unable to have a conversation.  Do not exercise at more than 6,000 feet above sea level (high elevation) if you are not used to exercising at high elevation. How do I exercise in a safe way?   Avoid overheating. Do not exercise in very high temperatures.  Wear loose-fitting, breathable clothes.  Avoid dehydration. Drink enough water before, during, and after exercise to keep your urine pale yellow.  Avoid overstretching. Because of hormone changes during pregnancy, it is easy to overstretch muscles, tendons, and ligaments during pregnancy.  Start slowly and ask your health care provider to recommend the types of exercise that are safe for you.  Do not exercise to lose weight. Follow these instructions at home:  Exercise on most days or all days of the week. Try to exercise for 30 minutes a day, 5 days a week, unless your health care provider tells you not to.  If   you actively exercised before your pregnancy and you are healthy, your health care provider may tell you to continue to do moderate to high-intensity exercise.  If you are just starting to exercise or did not exercise much before your pregnancy, your health care provider may tell you to do low to moderate-intensity  exercise. Questions to ask your health care provider  Is exercise safe for me?  What are signs that I should stop exercising?  Does my health condition mean that I should not exercise during pregnancy?  When should I avoid exercising during pregnancy? Stop exercising and contact a health care provider if: You have any unusual symptoms, such as:  Mild contractions of the uterus or cramps in the abdomen.  Dizziness that does not go away when you rest. Stop exercising and get help right away if: You have any unusual symptoms, such as:  Sudden, severe pain in your low back or your belly.  Mild contractions of the uterus or cramps in the abdomen that do not improve with rest and drinking fluids.  Chest pain.  Bleeding or fluid leaking from your vagina.  Shortness of breath. These symptoms may represent a serious problem that is an emergency. Do not wait to see if the symptoms will go away. Get medical help right away. Call your local emergency services (911 in the [place] Do not drive yourself to the hospital. Summary  Most women should exercise regularly throughout pregnancy. In rare cases, women with certain medical conditions or complications may be asked to limit or avoid exercise during pregnancy.  Do not exercise to lose weight during pregnancy.  Your health care provider will tell you what level of physical activity is right for you.  Stop exercising and contact a health care provider if you have mild contractions of the uterus or cramps in the abdomen. Get help right away if these contractions or cramps do not improve with rest and drinking fluids.  Stop exercising and get help right away if you have sudden, severe pain in your low back or belly, chest pain, shortness of breath, or bleeding or leaking of fluid from your vagina. This information is not intended to replace advice given to you by your health care provider. Make sure you discuss any questions you have with your  health care provider. Document Revised: 01/02/2019 Document Reviewed: 10/16/2018 Elsevier Patient Education  2020 [employer] Eating Plan for Pregnant Women While you are pregnant, your body requires additional nutrition to help support your growing baby. You also have a higher need for some vitamins and minerals, such as folic acid, calcium, iron, and vitamin D. Eating a healthy, well-balanced diet is very important for your health and your baby's health. Your need for extra calories varies for the three 3-month segments of your pregnancy (trimesters). For most women, it is recommended to consume:  150 extra calories a day during the first trimester.  300 extra calories a day during the second trimester.  300 extra calories a day during the third trimester. What are tips for following this plan?   Do not try to lose weight or go on a diet during pregnancy.  Limit your overall intake of foods that have "empty calories." These are foods that have little nutritional value, such as sweets, desserts, candies, and sugar-sweetened beverages.  Eat a variety of foods (especially fruits and vegetables) to get a full range of vitamins and minerals.  Take a prenatal vitamin to help meet your additional vitamin and mineral needs   during pregnancy, specifically for folic acid, iron, calcium, and vitamin D.  Remember to stay active. Ask your health care provider what types of exercise and activities are safe for you.  Practice good food safety and cleanliness. Wash your hands before you eat and after you prepare raw meat. Wash all fruits and vegetables well before peeling or eating. Taking these actions can help to prevent food-borne illnesses that can be very dangerous to your baby, such as listeriosis. Ask your health care provider for more information about listeriosis. What does 150 extra calories look like? Healthy options that provide 150 extra calories each day could be any of the  following:  6-8 oz (170-230 g) of plain low-fat yogurt with  cup of berries.  1 apple with 2 teaspoons (11 g) of peanut butter.  Cut-up vegetables with  cup (60 g) of hummus.  8 oz (230 mL) or 1 cup of low-fat chocolate milk.  1 stick of string cheese with 1 medium orange.  1 peanut butter and jelly sandwich that is made with one slice of whole-wheat bread and 1 tsp (5 g) of peanut butter. For 300 extra calories, you could eat two of those healthy options each day. What is a healthy amount of weight to gain? The right amount of weight gain for you is based on your BMI before you became pregnant. If your BMI:  Was less than 18 (underweight), you should gain 28-40 lb (13-18 kg).  Was 18-24.9 (normal), you should gain 25-35 lb (11-16 kg).  Was 25-29.9 (overweight), you should gain 15-25 lb (7-11 kg).  Was 30 or greater (obese), you should gain 11-20 lb (5-9 kg). What if I am having twins or multiples? Generally, if you are carrying twins or multiples:  You may need to eat 300-600 extra calories a day.  The recommended range for total weight gain is 25-54 lb (11-25 kg), depending on your BMI before pregnancy.  Talk with your health care provider to find out about nutritional needs, weight gain, and exercise that is right for you. What foods can I eat?  Fruits All fruits. Eat a variety of colors and types of fruit. Remember to wash your fruits well before peeling or eating. Vegetables All vegetables. Eat a variety of colors and types of vegetables. Remember to wash your vegetables well before peeling or eating. Grains All grains. Choose whole grains, such as whole-wheat bread, oatmeal, or brown rice. Meats and other protein foods Lean meats, including chicken, turkey, fish, and lean cuts of beef, veal, or pork. If you eat fish or seafood, choose options that are higher in omega-3 fatty acids and lower in mercury, such as salmon, herring, mussels, trout, sardines, pollock,  shrimp, crab, and lobster. Tofu. Tempeh. Beans. Eggs. Peanut butter and other nut butters. Make sure that all meats, poultry, and eggs are cooked to food-safe temperatures or "well-done." Two or more servings of fish are recommended each week in order to get the most benefits from omega-3 fatty acids that are found in seafood. Choose fish that are lower in mercury. You can find more information online:  www.fda.gov Dairy Pasteurized milk and milk alternatives (such as almond milk). Pasteurized yogurt and pasteurized cheese. Cottage cheese. Sour cream. Beverages Water. Juices that contain 100% fruit juice or vegetable juice. Caffeine-free teas and decaffeinated coffee. Drinks that contain caffeine are okay to drink, but it is better to avoid caffeine. Keep your total caffeine intake to less than 200 mg each day (which is 12 oz   or 355 mL of coffee, tea, or soda) or the limit as told by your health care provider. Fats and oils Fats and oils are okay to include in moderation. Sweets and desserts Sweets and desserts are okay to include in moderation. Seasoning and other foods All pasteurized condiments. The items listed above may not be a complete list of foods and beverages you can eat. Contact a dietitian for more information. What foods are not recommended? Fruits Unpasteurized fruit juices. Vegetables Raw (unpasteurized) vegetable juices. Meats and other protein foods Lunch meats, bologna, hot dogs, or other deli meats. (If you must eat those meats, reheat them until they are steaming hot.) Refrigerated pat, meat spreads from a meat counter, smoked seafood that is found in the refrigerated section of a store. Raw or undercooked meats, poultry, and eggs. Raw fish, such as sushi or sashimi. Fish that have high mercury content, such as tilefish, shark, swordfish, and king mackerel. To learn more about mercury in fish, talk with your health care provider or look for online resources, such  as:  www.fda.gov Dairy Raw (unpasteurized) milk and any foods that have raw milk in them. Soft cheeses, such as feta, queso blanco, queso fresco, Brie, Camembert cheeses, blue-veined cheeses, and Panela cheese (unless it is made with pasteurized milk, which must be stated on the label). Beverages Alcohol. Sugar-sweetened beverages, such as sodas, teas, or energy drinks. Seasoning and other foods Homemade fermented foods and drinks, such as pickles, sauerkraut, or kombucha drinks. (Store-bought pasteurized versions of these are okay.) Salads that are made in a store or deli, such as ham salad, chicken salad, egg salad, tuna salad, and seafood salad. The items listed above may not be a complete list of foods and beverages you should avoid. Contact a dietitian for more information. Where to find more information To calculate the number of calories you need based on your height, weight, and activity level, you can use an online calculator such as:  www.choosemyplate.gov/MyPlatePlan To calculate how much weight you should gain during pregnancy, you can use an online pregnancy weight gain calculator such as:  www.choosemyplate.gov/pregnancy-weight-gain-calculator Summary  While you are pregnant, your body requires additional nutrition to help support your growing baby.  Eat a variety of foods, especially fruits and vegetables to get a full range of vitamins and minerals.  Practice good food safety and cleanliness. Wash your hands before you eat and after you prepare raw meat. Wash all fruits and vegetables well before peeling or eating. Taking these actions can help to prevent food-borne illnesses, such as listeriosis, that can be very dangerous to your baby.  Do not eat raw meat or fish. Do not eat fish that have high mercury content, such as tilefish, shark, swordfish, and king mackerel. Do not eat unpasteurized (raw) dairy.  Take a prenatal vitamin to help meet your additional vitamin and  mineral needs during pregnancy, specifically for folic acid, iron, calcium, and vitamin D. This information is not intended to replace advice given to you by your health care provider. Make sure you discuss any questions you have with your health care provider. Document Revised: 01/30/2019 Document Reviewed: 06/08/2017 Elsevier Patient Education  2020 Else[employer] Inc. Prenatal Care Prenatal care is health care during pregnancy. It helps you and your unborn baby (fetus) stay as healthy as possible. Prenatal care may be provided by a midwife, a family practice health care provider, or a childbirth and pregnancy specialist (obstetrician). How does this affect me? During pregnancy, you will be closely monitored   for any new conditions that might develop. To lower your risk of pregnancy complications, you and your health care provider will talk about any underlying conditions you have. How does this affect my baby? Early and consistent prenatal care increases the chance that your baby will be healthy during pregnancy. Prenatal care lowers the risk that your baby will be:  Born early (prematurely).  Smaller than expected at birth (small for gestational age). What can I expect at the first prenatal care visit? Your first prenatal care visit will likely be the longest. You should schedule your first prenatal care visit as soon as you know that you are pregnant. Your first visit is a good time to talk about any questions or concerns you have about pregnancy. At your visit, you and your health care provider will talk about:  Your medical history, including: ? Any past pregnancies. ? Your family's medical history. ? The baby's father's medical history. ? Any long-term (chronic) health conditions you have and how you manage them. ? Any surgeries or procedures you have had. ? Any current over-the-counter or prescription medicines, herbs, or supplements you are taking.  Other factors that could pose a risk  to your baby, including:  Your home setting and your stress levels, including: ? Exposure to abuse or violence. ? Household financial strain. ? Mental health conditions you have.  Your daily health habits, including diet and exercise. Your health care provider will also:  Measure your weight, height, and blood pressure.  Do a physical exam, including a pelvic and breast exam.  Perform blood tests and urine tests to check for: ? Urinary tract infection. ? Sexually transmitted infections (STIs). ? Low iron levels in your blood (anemia). ? Blood type and certain proteins on red blood cells (Rh antibodies). ? Infections and immunity to viruses, such as hepatitis B and rubella. ? HIV (human immunodeficiency virus).  Do an ultrasound to confirm your baby's growth and development and to help predict your estimated due date (EDD). This ultrasound is done with a probe that is inserted into the vagina (transvaginal ultrasound).  Discuss your options for genetic screening.  Give you information about how to keep yourself and your baby healthy, including: ? Nutrition and taking vitamins. ? Physical activity. ? How to manage pregnancy symptoms such as nausea and vomiting (morning sickness). ? Infections and substances that may be harmful to your baby and how to avoid them. ? Food safety. ? Dental care. ? Working. ? Travel. ? Warning signs to watch for and when to call your health care provider. How often will I have prenatal care visits? After your first prenatal care visit, you will have regular visits throughout your pregnancy. The visit schedule is often as follows:  Up to week 28 of pregnancy: once every 4 weeks.  28-36 weeks: once every 2 weeks.  After 36 weeks: every week until delivery. Some women may have visits more or less often depending on any underlying health conditions and the health of the baby. Keep all follow-up and prenatal care visits as told by your health care  provider. This is important. What happens during routine prenatal care visits? Your health care provider will:  Measure your weight and blood pressure.  Check for fetal heart sounds.  Measure the height of your uterus in your abdomen (fundal height). This may be measured starting around week 20 of pregnancy.  Check the position of your baby inside your uterus.  Ask questions about your diet, sleeping patterns, and   whether you can feel the baby move.  Review warning signs to watch for and signs of labor.  Ask about any pregnancy symptoms you are having and how you are dealing with them. Symptoms may include: ? Headaches. ? Nausea and vomiting. ? Vaginal discharge. ? Swelling. ? Fatigue. ? Constipation. ? Any discomfort, including back or pelvic pain. Make a list of questions to ask your health care provider at your routine visits. What tests might I have during prenatal care visits? You may have blood, urine, and imaging tests throughout your pregnancy, such as:  Urine tests to check for glucose, protein, or signs of infection.  Glucose tests to check for a form of diabetes that can develop during pregnancy (gestational diabetes mellitus). This is usually done around week 24 of pregnancy.  An ultrasound to check your baby's growth and development and to check for birth defects. This is usually done around week 20 of pregnancy.  A test to check for group B strep (GBS) infection. This is usually done around week 36 of pregnancy.  Genetic testing. This may include blood or imaging tests, such as an ultrasound. Some genetic tests are done during the first trimester and some are done during the second trimester. What else can I expect during prenatal care visits? Your health care provider may recommend getting certain vaccines during pregnancy. These may include:  A yearly flu shot (annual influenza vaccine). This is especially important if you will be pregnant during flu  season.  Tdap (tetanus, diphtheria, pertussis) vaccine. Getting this vaccine during pregnancy can protect your baby from whooping cough (pertussis) after birth. This vaccine may be recommended between weeks 27 and 36 of pregnancy. Later in your pregnancy, your health care provider may give you information about:  Childbirth and breastfeeding classes.  Choosing a health care provider for your baby.  Umbilical cord banking.  Breastfeeding.  Birth control after your baby is born.  The hospital labor and delivery unit and how to tour it.  Registering at the hospital before you go into labor. Where to find more information  Office on Women's Health: womenshealth.gov  American Pregnancy Association: americanpregnancy.org  March of Dimes: marchofdimes.org Summary  Prenatal care helps you and your baby stay as healthy as possible during pregnancy.  Your first prenatal care visit will most likely be the longest.  You will have visits and tests throughout your pregnancy to monitor your health and your baby's health.  Bring a list of questions to your visits to ask your health care provider.  Make sure to keep all follow-up and prenatal care visits with your health care provider. This information is not intended to replace advice given to you by your health care provider. Make sure you discuss any questions you have with your health care provider. Document Revised: 01/01/2019 Document Reviewed: 09/10/2017 Elsevier Patient Education  2020 Elsevier Inc.     COVID-19 and Your Pregnancy FAQ  How can I prevent infection with COVID-19 during my pregnancy? Social distancing is key. Please limit any interactions in public. Try and work from home if possible. Frequently wash your hands after touching possibly contaminated surfaces. Avoid touching your face.  Minimize trips to the store. Consider online ordering when possible.   Should I wear a mask? YES. It is recommended by the CDC  that all people wear a cloth mask or facial covering in public. You should wear a mask to your visits in the office. This will help reduce transmission as well as your   risk or acquiring COVID-19. New studies are showing that even asymptomatic individuals can spread the virus from talking.   Where can I get a mask? [place] and the city of [place] are partnering to provide masks to community members. You can pick up a mask from several locations. This website also has instructions about how to make a mask by sewing or without sewing by using a t-shirt or bandana.  https://www.[place].gov/i-want-to/learn-about/covid-19-information-and-updates/covid-19-face-mask-project  Studies have shown that if you were a tube or nylon stocking from pantyhose over a cloth mask it makes the cloth mask almost as effective as a N95 mask.  https://www.npr.org/sections/goatsandsoda/2019/01/15/840146830/adding-a-nylon-stocking-layer-could-boost-protection-from-cloth-masks-study-find  What are the symptoms of COVID-19? Fever (greater than 100.4 F), dry cough, shortness of breath.  Am I more at risk for COVID-19 since I am pregnant? There is not currently data showing that pregnant women are more adversely impacted by COVID-19 than the general population. However, we know that pregnant women tend to have worse respiratory complications from similar diseases such as the flu and SARS and for this reason should be considered an at-risk population.  What do I do if I am experiencing the symptoms of COVID-19? Testing is being limited because of test availability. If you are experiencing symptoms you should quarantine yourself, and the members of your family, for at least 2 weeks at home.   Please visit this website for more information: https://www.cdc.gov/coronavirus/2019-ncov/if-you-are-sick/steps-when-sick.html  When should I go to the Emergency Room? Please go to the emergency room if you are experiencing  ANY of these symptoms*:  1.    Difficulty breathing or shortness of breath 2.    Persistent pain or pressure in the chest 3.    Confusion or difficulty being aroused (or awakened) 4.    Bluish lips or face  *This list is not all inclusive. Please consult our office for any other symptoms that are severe or concerning.  What do I do if I am having difficulty breathing? You should go to the Emergency Room for evaluation. At this time they have a tent set up for evaluating patients with COVID-19 symptoms.   How will my prenatal care be different because of the COVID-19 pandemic? It has been recommended to reduce the frequency of face-to-face visits and use resources such as telephone and virtual visits when possible. Using a scale, blood pressure machine and fetal doppler at home can further help reduce face-to-face visits. You will be provided with additional information on this topic.  We ask that you come to your visits alone to minimize potential exposures to  COVID-19.  How can I receive childbirth education? At this time in-person classes have been cancelled. You can register for online childbirth education, breastfeeding, and newborn care classes.  Please visit:  [email or web address] for more information  How will my hospital birth experience be different? The hospital is currently limiting visitors. This means that while you are in labor you can only have one person at the hospital with you. Additional family members will not be allowed to wait in the building or outside your room. Your one support person can be the father of the baby, a relative, a doula, or a friend. Once one support person is designated that person will wear a band. This band cannot be shared with multiple people.  Nitrous Gas is not being offered for pain relief since the tubing and filter for the machine can not be sanitized in a way to guarantee prevention of transmission of COVID-19.  Nasal cannula use of    oxygen for fetal indications has also been discontinued.  Currently a clear plastic sheet is being hung between mom and the delivering provider during pushing and delivery to help prevent transmission of COVID-19.      How long will I stay in the hospital for after giving birth? It is also recommended that discharge home be expedited during the COVID-19 outbreak. This means staying for 1 day after a vaginal delivery and 2 days after a cesarean section. Patients who need to stay longer for medical reasons are allowed to do so, but the goal will be for expedited discharge home.   What if I have COVID-19 and I am in labor? We ask that you wear a mask while on labor and delivery. We will try and accommodate you being placed in a room that is capable of filtering the air. Please call ahead if you are in labor and on your way to the hospital. The phone number for labor and delivery at [facility] is [phone].  If I have COVID-19 when my baby is born how can I prevent my baby from contracting COVID-19? This is an issue that will have to be discussed on a case-by-case basis. Current recommendations suggest providing separate isolation rooms for both the mother and new infant as well as limiting visitors. However, there are practical challenges to this recommendation. The situation will assuredly change and decisions will be influenced by the desires of the mother and availability of space.  Some suggestions are the use of a curtain or physical barrier between mom and infant, hand hygiene, mom wearing a mask, or 6 feet of spacing between a mom and infant.   Can I breastfeed during the COVID-19 pandemic?   Yes, breastfeeding is encouraged.  Can I breastfeed if I have COVID-19? Yes. Covid-19 has not been found in breast milk. This means you cannot give COVID-19 to your child through breast milk. Breast feeding will also help pass antibodies to fight infection to your baby.   What  precautions should I take when breastfeeding if I have COVID-19? If a mother and newborn do room-in and the mother wishes to feed at the breast, she should put on a facemask and practice hand hygiene before each feeding.  What precautions should I take when pumping if I have COVID-19? Prior to expressing breast milk, mothers should practice hand hygiene. After each pumping session, all parts that come into contact with breast milk should be thoroughly washed and the entire pump should be appropriately disinfected per the manufacturer's instructions. This expressed breast milk should be fed to the newborn by a healthy caregiver.  What if I am pregnant and work in healthcare? Based on limited data regarding COVID-19 and pregnancy, ACOG currently does not propose creating additional restrictions on pregnant health care personnel because of COVID-19 alone. Pregnant women do not appear to be at higher risk of severe disease related to COVID-19. Pregnant health care personnel should follow CDC risk assessment and infection control guidelines for health care personnel exposed to patients with suspected or confirmed COVID-19. Adherence to recommended infection prevention and control practices is an important part of protecting all health care personnel in health care settings.    Information on COVID-19 in pregnancy is very limited; however, facilities may want to consider limiting exposure of pregnant health care personnel to patients with confirmed or suspected COVID-19 infection, especially during higher-risk procedures (eg, aerosol-generating procedures), if feasible, based on staffing availability.    

## 2019-11-21 NOTE — Progress Notes (Signed)
NOB today. LMP 09/18/2019
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
## 2019-11-22 LAB — RPR+RH+ABO+RUB AB+AB SCR+CB...
Antibody Screen: NEGATIVE
HIV Screen 4th Generation wRfx: NONREACTIVE
Hematocrit: 37.5 % (ref 34.0–46.6)
Hemoglobin: 12.4 g/dL (ref 11.1–15.9)
Hepatitis B Surface Ag: NEGATIVE
MCH: 28.8 pg (ref 26.6–33.0)
MCHC: 33.1 g/dL (ref 31.5–35.7)
MCV: 87 fL (ref 79–97)
Platelets: 211 10*3/uL (ref 150–450)
RBC: 4.31 x10E6/uL (ref 3.77–5.28)
RDW: 14.3 % (ref 11.7–15.4)
RPR Ser Ql: NONREACTIVE
Rh Factor: POSITIVE
Rubella Antibodies, IGG: <0.9 index — ABNORMAL LOW (ref >0.99)
Varicella zoster IgG: <135 index — ABNORMAL LOW (ref >165)
WBC: 5.5 10*3/uL (ref 3.4–10.8)

## 2019-11-23 LAB — URINE CULTURE

## 2019-11-25 LAB — CERVICOVAGINAL ANCILLARY ONLY
Chlamydia: NEGATIVE
Comment: NEGATIVE
Comment: NEGATIVE
Comment: NORMAL
Neisseria Gonorrhea: NEGATIVE
Trichomonas: NEGATIVE

## 2019-12-03 ENCOUNTER — Other Ambulatory Visit: Payer: Self-pay

## 2019-12-03 ENCOUNTER — Ambulatory Visit (INDEPENDENT_AMBULATORY_CARE_PROVIDER_SITE_OTHER): Payer: Medicaid Other

## 2019-12-03 ENCOUNTER — Ambulatory Visit (INDEPENDENT_AMBULATORY_CARE_PROVIDER_SITE_OTHER): Payer: Medicaid Other | Admitting: Advanced Practice Midwife

## 2019-12-03 ENCOUNTER — Encounter: Payer: Self-pay | Admitting: Advanced Practice Midwife

## 2019-12-03 VITALS — BP 100/60 | Wt 116.0 lb

## 2019-12-03 DIAGNOSIS — Z3A09 9 weeks gestation of pregnancy: Secondary | ICD-10-CM

## 2019-12-03 DIAGNOSIS — Z3400 Encounter for supervision of normal first pregnancy, unspecified trimester: Secondary | ICD-10-CM

## 2019-12-03 DIAGNOSIS — Z3689 Encounter for other specified antenatal screening: Secondary | ICD-10-CM

## 2019-12-03 DIAGNOSIS — Z3687 Encounter for antenatal screening for uncertain dates: Secondary | ICD-10-CM

## 2019-12-03 DIAGNOSIS — Z3401 Encounter for supervision of normal first pregnancy, first trimester: Secondary | ICD-10-CM

## 2019-12-03 NOTE — Progress Notes (Addendum)
  Routine Prenatal Care Visit  Subjective  Catherine Dunn is a 19 y.o. G1P0 at [redacted]w[redacted]d being seen today for ongoing prenatal care.  She is currently monitored for the following issues for this low-risk pregnancy and has Supervision of normal first pregnancy, antepartum on their problem list.  ----------------------------------------------------------------------------------- Patient reports no complaints.  We discussed dating scan and new due date based on ultrasound.  . Vag. Bleeding: None.   . Leaking Fluid denies.  ----------------------------------------------------------------------------------- The following portions of the patient's history were reviewed and updated as appropriate: allergies, current medications, past family history, past medical history, past social history, past surgical history and problem list. Problem list updated.  Objective  Blood pressure 100/60, weight 116 lb (52.6 kg), last menstrual period 09/18/2019. Pregravid weight 118 lb (53.5 kg) Total Weight Gain -2 lb (-0.907 kg) Urinalysis: Urine Protein    Urine Glucose    Fetal Status: Fetal Heart Rate (bpm): 171          Dating scan: 9 weeks 5 days, EDD adjusted for 8 day difference.  General:  Alert, oriented and cooperative. Patient is in no acute distress.  Skin: Skin is warm and dry. No rash noted.   Cardiovascular: Normal heart rate noted  Respiratory: Normal respiratory effort, no problems with respiration noted  Abdomen: Soft, gravid, appropriate for gestational age. Pain/Pressure: Absent     Pelvic:  Cervical exam deferred        Extremities: Normal range of motion.     Mental Status: Normal mood and affect. Normal behavior. Normal judgment and thought content.   Assessment   19 y.o. G1P0 at [redacted]w[redacted]d by  07/02/2020, by 9w Ultrasound presenting for routine prenatal visit  Plan   pregnancy Problems (from 11/21/19 to present)    No problems associated with this episode.       Preterm labor symptoms  and general obstetric precautions including but not limited to vaginal bleeding, contractions, leaking of fluid and fetal movement were reviewed in detail with the patient.    Return in about 2 weeks (around 12/17/2019) for rob and MaterniT 21.  Tresea Mall, CNM 12/03/2019 11:38 AM

## 2019-12-04 ENCOUNTER — Encounter: Payer: Self-pay | Admitting: Obstetrics and Gynecology

## 2019-12-04 ENCOUNTER — Ambulatory Visit (INDEPENDENT_AMBULATORY_CARE_PROVIDER_SITE_OTHER): Payer: Medicaid Other | Admitting: Obstetrics and Gynecology

## 2019-12-04 DIAGNOSIS — Z3400 Encounter for supervision of normal first pregnancy, unspecified trimester: Secondary | ICD-10-CM

## 2019-12-04 DIAGNOSIS — Z3A09 9 weeks gestation of pregnancy: Secondary | ICD-10-CM

## 2019-12-04 DIAGNOSIS — R1032 Left lower quadrant pain: Secondary | ICD-10-CM

## 2019-12-04 DIAGNOSIS — O209 Hemorrhage in early pregnancy, unspecified: Secondary | ICD-10-CM

## 2019-12-04 NOTE — Progress Notes (Signed)
Routine Prenatal Care Visit  Subjective  Catherine Dunn is a 19 y.o. G1P0 at [redacted]w[redacted]d being seen today for ongoing prenatal care.  She is currently monitored for the following issues for this low-risk pregnancy and has Supervision of normal first pregnancy, antepartum and Vaginal bleeding in pregnancy, first trimester on their problem list.  ----------------------------------------------------------------------------------- Patient reports red-orange spotting this morning. Now is more like brownish mild discharge.  Some mild cramping that has settled into her LLQ.    . Vag. Bleeding: Scant.   . Leaking Fluid denies.  ----------------------------------------------------------------------------------- The following portions of the patient's history were reviewed and updated as appropriate: allergies, current medications, past family history, past medical history, past social history, past surgical history and problem list. Problem list updated.  Objective  Blood pressure 128/82, weight 117 lb (53.1 kg), last menstrual period 09/18/2019. Pregravid weight 118 lb (53.5 kg) Total Weight Gain -1 lb (-0.454 kg) Urinalysis: Urine Protein    Urine Glucose    Fetal Status: Fetal Heart Rate (bpm): 173         General:  Alert, oriented and cooperative. Patient is in no acute distress.  Skin: Skin is warm and dry. No rash noted.   Cardiovascular: Normal heart rate noted  Respiratory: Normal respiratory effort, no problems with respiration noted  Abdomen: Soft, gravid, appropriate for gestational age.       Pelvic:  Cervical exam performed      Cervix appears closed. Possible ectropion on anterior lip of cervix at about 12 o'clock.  Not treated.    Extremities: Normal range of motion.     Mental Status: Normal mood and affect. Normal behavior. Normal judgment and thought content.   TVUS performed by me. Single, living IUP with cardiac activity. Heart rate 173 bpm.  No obvious [place] or other explanation for  bleeding. Left ovarian cystic area still present, likely corpus luteal cyst.  The cystic area is simple-appearing with no internal echoes.  Cervix appears normal and closed.   Assessment   19 y.o. G1P0 at [redacted]w[redacted]d by  07/02/2020, by Ultrasound presenting for work-in prenatal visit  Plan   pregnancy Problems (from 11/21/19 to present)    Problem Noted Resolved   Vaginal bleeding in pregnancy, first trimester 12/04/2019 by Will Bonnet, MD No   Supervision of normal first pregnancy, antepartum 11/21/2019 by Rod Can, CNM No   Overview Addendum 12/04/2019  1:42 PM by Will Bonnet, MD    Clinic [facility] Prenatal Labs  Dating By [redacted]w[redacted]d u/s Blood type: AB/Positive/-- (02/26 1145)   Genetic Screen 1 Screen:    AFP:     Quad:     NIPS: Antibody:Negative (02/26 1145)  Anatomic [place]  Rubella: <0.90 (02/26 1145) Varicella: @VZVIGG @  GTT 28 wk:  RPR: Non Reactive (02/26 1145)   Rhogam  HBsAg: Negative (02/26 1145)   Vaccines TDAP:                       Flu Shot: November 2020 HIV: Non Reactive (02/26 1145)   Baby Food Breast                               GBS:   Contraception  Pap: less than 58 yo  CBB     CS/VBAC NA   Support Person Partner Brandon              Preterm labor symptoms and general obstetric precautions  including but not limited to vaginal bleeding, contractions, leaking of fluid and fetal movement were reviewed in detail with the patient. Please refer to After Visit Summary for other counseling recommendations.   Patient reassured regarding symptoms and status of pregnancy. All questions answered.   Return in about 13 days (around 12/17/2019) for Keep previously scheduled appt .  Thomasene Mohair, MD, [facility], [facility] 12/04/2019 1:58 PM

## 2019-12-10 ENCOUNTER — Telehealth: Payer: Self-pay

## 2019-12-10 NOTE — Telephone Encounter (Signed)
Pt left msg on triage line asking to speak to someone in regards to genetic testing. She wants to know if her insurance will pay for it, if not, she will not have it done. Called pt back , no answer, LVMTRC. If she has pregnancy medicaid, they will pay fully.

## 2019-12-10 NOTE — Telephone Encounter (Signed)
Pt called back and is aware per [facility], medicaid pays full cost. She sais she has heard from different people they dont. Advised to call social worker to get that "last ok" if she feels better.

## 2019-12-17 ENCOUNTER — Ambulatory Visit (INDEPENDENT_AMBULATORY_CARE_PROVIDER_SITE_OTHER): Payer: Medicaid Other | Admitting: Obstetrics and Gynecology

## 2019-12-17 ENCOUNTER — Other Ambulatory Visit: Payer: Self-pay

## 2019-12-17 ENCOUNTER — Encounter: Payer: Self-pay | Admitting: Obstetrics and Gynecology

## 2019-12-17 VITALS — BP 110/60 | Wt 117.0 lb

## 2019-12-17 DIAGNOSIS — Z3401 Encounter for supervision of normal first pregnancy, first trimester: Secondary | ICD-10-CM

## 2019-12-17 DIAGNOSIS — Z3A11 11 weeks gestation of pregnancy: Secondary | ICD-10-CM

## 2019-12-17 DIAGNOSIS — Z3400 Encounter for supervision of normal first pregnancy, unspecified trimester: Secondary | ICD-10-CM

## 2019-12-17 DIAGNOSIS — Z1379 Encounter for other screening for genetic and chromosomal anomalies: Secondary | ICD-10-CM

## 2019-12-17 NOTE — Patient Instructions (Signed)

## 2019-12-17 NOTE — Progress Notes (Signed)
    Routine Prenatal Care Visit  Subjective  Catherine Dunn is a 19 y.o. G1P0 at [redacted]w[redacted]d being seen today for ongoing prenatal care.  She is currently monitored for the following issues for this low-risk pregnancy and has Supervision of normal first pregnancy, antepartum and Vaginal bleeding in pregnancy, first trimester on their problem list.  ----------------------------------------------------------------------------------- Patient reports no complaints.   Contractions: Not present. Vag. Bleeding: None.  Movement: Absent. Denies leaking of fluid.  ----------------------------------------------------------------------------------- The following portions of the patient's history were reviewed and updated as appropriate: allergies, current medications, past family history, past medical history, past social history, past surgical history and problem list. Problem list updated.   Objective  Blood pressure 110/60, weight 117 lb (53.1 kg), last menstrual period 09/18/2019. Pregravid weight 118 lb (53.5 kg) Total Weight Gain -1 lb (-0.454 kg) Urinalysis:      Fetal Status: Fetal Heart Rate (bpm): 165   Movement: Absent     General:  Alert, oriented and cooperative. Patient is in no acute distress.  Skin: Skin is warm and dry. No rash noted.   Cardiovascular: Normal heart rate noted  Respiratory: Normal respiratory effort, no problems with respiration noted  Abdomen: Soft, gravid, appropriate for gestational age. Pain/Pressure: Absent     Pelvic:  Cervical exam deferred        Extremities: Normal range of motion.  Edema: None  Mental Status: Normal mood and affect. Normal behavior. Normal judgment and thought content.     Assessment   [name] at 102w5d by  07/02/2020, by Ultrasound presenting for routine prenatal visit  Plan   pregnancy Problems (from 11/21/19 to present)    Problem Noted Resolved   Vaginal bleeding in pregnancy, first trimester 12/04/2019 by Conard Novak, MD  No   Supervision of normal first pregnancy, antepartum 11/21/2019 by Tresea Mall, CNM No   Overview Addendum 12/17/2019  3:41 PM by Natale Milch, MD    Clinic [facility] Prenatal Labs  Dating By [redacted]w[redacted]d u/s Blood type: AB/Positive/-- (02/26 1145)   Genetic Screen 1 Screen:    AFP:     Quad:     NIPS: Antibody:Negative (02/26 1145)  Anatomic [place]  Rubella: <0.90 (02/26 1145)  NONIMMUNE  Varicella: NONIMMUNE  GTT 28 wk:  RPR: Non Reactive (02/26 1145)   Rhogam  HBsAg: Negative (02/26 1145)   Vaccines TDAP:                       Flu Shot: November 2020 HIV: Non Reactive (02/26 1145)   Baby Food Breast                               GBS:   Contraception  Pap: less than 21 yo  CBB     CS/VBAC NA   Support Person Partner Apolinar Junes             Maternit21 testing today.   Gestational age appropriate obstetric precautions including but not limited to vaginal bleeding, contractions, leaking of fluid and fetal movement were reviewed in detail with the patient.    Return in about 4 weeks (around 01/14/2020) for ROB in person.  Natale Milch MD [facility], [facility] 12/17/2019, 3:48 PM

## 2019-12-17 NOTE — Progress Notes (Signed)
ROB/Maternit 21 No concerns Denies cramping/spotting

## 2019-12-22 LAB — MATERNIT21 PLUS CORE+SCA
Fetal Fraction: 10
Monosomy X (Turner Syndrome): NOT DETECTED
Result (T21): NEGATIVE
Trisomy 13 (Patau syndrome): NEGATIVE
Trisomy 18 (Edwards syndrome): NEGATIVE
Trisomy 21 (Down syndrome): NEGATIVE
XXX (Triple X Syndrome): NOT DETECTED
XXY (Klinefelter Syndrome): NOT DETECTED
XYY (Jacobs Syndrome): NOT DETECTED

## 2020-01-14 ENCOUNTER — Encounter: Payer: Medicaid Other | Admitting: Advanced Practice Midwife

## 2020-01-14 ENCOUNTER — Other Ambulatory Visit: Payer: Self-pay

## 2020-01-14 ENCOUNTER — Ambulatory Visit (INDEPENDENT_AMBULATORY_CARE_PROVIDER_SITE_OTHER): Payer: Medicaid Other | Admitting: Obstetrics and Gynecology

## 2020-01-14 VITALS — BP 122/78 | Wt 116.0 lb

## 2020-01-14 DIAGNOSIS — Z3402 Encounter for supervision of normal first pregnancy, second trimester: Secondary | ICD-10-CM

## 2020-01-14 DIAGNOSIS — Z3A15 15 weeks gestation of pregnancy: Secondary | ICD-10-CM

## 2020-01-14 DIAGNOSIS — Z3400 Encounter for supervision of normal first pregnancy, unspecified trimester: Secondary | ICD-10-CM

## 2020-01-14 DIAGNOSIS — Z363 Encounter for antenatal screening for malformations: Secondary | ICD-10-CM

## 2020-01-14 LAB — POCT URINALYSIS DIPSTICK OB
Glucose, UA: NEGATIVE
POC,PROTEIN,UA: NEGATIVE

## 2020-01-14 NOTE — Progress Notes (Signed)
    Routine Prenatal Care Visit  Subjective  Catherine Dunn is a 19 y.o. G1P0 at [redacted]w[redacted]d being seen today for ongoing prenatal care.  She is currently monitored for the following issues for this low-risk pregnancy and has Supervision of normal first pregnancy, antepartum and Vaginal bleeding in pregnancy, first trimester on their problem list.  ----------------------------------------------------------------------------------- Patient reports no complaints.   Contractions: Not present. Vag. Bleeding: None.  Movement: Absent. Denies leaking of fluid.  ----------------------------------------------------------------------------------- The following portions of the patient's history were reviewed and updated as appropriate: allergies, current medications, past family history, past medical history, past social history, past surgical history and problem list. Problem list updated.   Objective  Blood pressure 122/78, weight 116 lb (52.6 kg), last menstrual period 09/18/2019. Pregravid weight 118 lb (53.5 kg) Total Weight Gain -2 lb (-0.907 kg) Urinalysis:      Fetal Status: Fetal Heart Rate (bpm): 155   Movement: Absent     General:  Alert, oriented and cooperative. Patient is in no acute distress.  Skin: Skin is warm and dry. No rash noted.   Cardiovascular: Normal heart rate noted  Respiratory: Normal respiratory effort, no problems with respiration noted  Abdomen: Soft, gravid, appropriate for gestational age. Pain/Pressure: Absent     Pelvic:  Cervical exam deferred        Extremities: Normal range of motion.     ental Status: Normal mood and affect. Normal behavior. Normal judgment and thought content.     Assessment   [name] at [redacted]w[redacted]d by  07/02/2020, by Ultrasound presenting for routine prenatal visit  Plan   pregnancy Problems (from 11/21/19 to present)    Problem Noted Resolved   Vaginal bleeding in pregnancy, first trimester 12/04/2019 by Conard Novak, MD No   Supervision of normal first pregnancy, antepartum 11/21/2019 by Tresea Mall, CNM No   Overview Addendum 12/17/2019  3:41 PM by Natale Milch, MD    Clinic [facility] Prenatal Labs  Dating By [redacted]w[redacted]d u/s Blood type: AB/Positive/-- (02/26 1145)   Genetic Screen 1 Screen:    AFP:     Quad:     NIPS: Antibody:Negative (02/26 1145)  Anatomic [place]  Rubella: <0.90 (02/26 1145)  NONIMMUNE  Varicella: NONIMMUNE  GTT 28 wk:  RPR: Non Reactive (02/26 1145)   Rhogam  HBsAg: Negative (02/26 1145)   Vaccines TDAP:                       Flu Shot: November 2020 HIV: Non Reactive (02/26 1145)   Baby Food Breast                               GBS:   Contraception  Pap: less than 21 yo  CBB     CS/VBAC NA   Support Person Partner Apolinar Junes              Gestational age appropriate obstetric precautions including but not limited to vaginal bleeding, contractions, leaking of fluid and fetal movement were reviewed in detail with the patient.    Return in about 4 weeks (around 02/11/2020) for ROB and anatomy scan.  Vena Austria, MD, [facility], [facility] 01/14/2020, 4:23 PM

## 2020-02-11 ENCOUNTER — Ambulatory Visit (INDEPENDENT_AMBULATORY_CARE_PROVIDER_SITE_OTHER): Payer: Medicaid Other

## 2020-02-11 ENCOUNTER — Ambulatory Visit (INDEPENDENT_AMBULATORY_CARE_PROVIDER_SITE_OTHER): Payer: Medicaid Other | Admitting: Certified Nurse Midwife

## 2020-02-11 ENCOUNTER — Other Ambulatory Visit: Payer: Self-pay

## 2020-02-11 VITALS — BP 120/80 | Wt 121.0 lb

## 2020-02-11 DIAGNOSIS — Z363 Encounter for antenatal screening for malformations: Secondary | ICD-10-CM | POA: Diagnosis not present

## 2020-02-11 DIAGNOSIS — Z3A19 19 weeks gestation of pregnancy: Secondary | ICD-10-CM

## 2020-02-11 DIAGNOSIS — Z3400 Encounter for supervision of normal first pregnancy, unspecified trimester: Secondary | ICD-10-CM | POA: Diagnosis not present

## 2020-02-11 DIAGNOSIS — Z3402 Encounter for supervision of normal first pregnancy, second trimester: Secondary | ICD-10-CM

## 2020-02-11 LAB — POCT URINALYSIS DIPSTICK OB
Glucose, UA: NEGATIVE
POC,PROTEIN,UA: NEGATIVE

## 2020-02-11 NOTE — Progress Notes (Signed)
ROB/anatomy scan at 19wk5d: Feeling some fetal movement. No bleeding or pain.  Anatomy scan today: CGA 19wk5d, normal anatomy, anterior placenta. FCA 157  A: IUP at 19wk5d S=D  P: RTO in 4 weeks for ROB  Farrel Conners, CNM

## 2020-03-03 ENCOUNTER — Emergency Department
Admission: EM | Admit: 2020-03-03 | Discharge: 2020-03-03 | Disposition: A | Discharge status: Home / Self Care | Payer: Medicaid Other | Attending: Emergency Medicine | Admitting: Emergency Medicine

## 2020-03-03 ENCOUNTER — Other Ambulatory Visit: Payer: Self-pay

## 2020-03-03 ENCOUNTER — Encounter: Payer: Self-pay | Admitting: Emergency Medicine

## 2020-03-03 DIAGNOSIS — Z8616 Personal history of COVID-19: Secondary | ICD-10-CM | POA: Insufficient documentation

## 2020-03-03 DIAGNOSIS — F17290 Nicotine dependence, other tobacco product, uncomplicated: Secondary | ICD-10-CM | POA: Insufficient documentation

## 2020-03-03 DIAGNOSIS — J029 Acute pharyngitis, unspecified: Secondary | ICD-10-CM | POA: Diagnosis present

## 2020-03-03 LAB — GROUP A STREP BY PCR: Group A Strep by PCR: NOT DETECTED

## 2020-03-03 NOTE — ED Triage Notes (Signed)
Patient presents to the ED with sore throat x 4 days.  Patient states she has had a somewhat elevated temp, but never as high as 100.4.  Patient denies cough or congestion.  Patient is in no obvious distress at this time.  Patient is approx. [redacted] weeks pregnant.

## 2020-03-03 NOTE — ED Provider Notes (Signed)
Emergency Department Provider Note  ____________________________________________  Time seen: Approximately 8:10 PM  I have reviewed the triage vital signs and the nursing notes.   HISTORY  Chief Complaint Sore Throat   Historian Patient    HPI Catherine Dunn is a 19 y.o. female presents to the emergency department with pharyngitis for the past 4 days.  Patient has had low-grade fever at home.  She denies associated rhinorrhea, nasal congestion or nonproductive cough.  No chest pain or chest tightness.  No abdominal pain, pelvic pain, vaginal bleeding or change in vaginal fluids.  Patient states that she had COVID-19 last year would not like to be retested.   Past Medical History:  Diagnosis Date  . COVID-19 virus infection   . Gastroenteritis      Immunizations up to date:  Yes.     Past Medical History:  Diagnosis Date  . COVID-19 virus infection   . Gastroenteritis     Patient Active Problem List   Diagnosis Date Noted  . Vaginal bleeding in pregnancy, first trimester 12/04/2019  . Supervision of normal first pregnancy, antepartum 11/21/2019    Past Surgical History:  Procedure Laterality Date  . WISDOM TOOTH EXTRACTION      Prior to Admission medications   Not on File    Allergies Patient has no known allergies.  Family History  Problem Relation Age of Onset  . COPD Mother   . Heart attack Mother   . Diverticulitis Mother   . Hypertension Mother   . Ovarian cancer Mother     Social History Social History   Tobacco Use  . Smoking status: Current Every Day Smoker    Types: E-cigarettes  . Smokeless tobacco: Never Used  Substance Use Topics  . Alcohol use: Not Currently    Comment: occasionally  . Drug use: No     Review of Systems  Constitutional: No fever/chills Eyes:  No discharge ENT: Patient has pharyngitis.  Respiratory: no cough. No SOB/ use of accessory muscles to breath Gastrointestinal:   No nausea, no vomiting.  No  diarrhea.  No constipation. Musculoskeletal: Negative for musculoskeletal pain. Skin: Negative for rash, abrasions, lacerations, ecchymosis.  ____________________________________________   PHYSICAL EXAM:  VITAL SIGNS: ED Triage Vitals  Enc Vitals Group     BP 03/03/20 1658 124/77     Pulse Rate 03/03/20 1658 (!) 107     Resp 03/03/20 1658 18     Temp 03/03/20 1658 99.2 F (37.3 C)     Temp Source 03/03/20 1658 Oral     SpO2 03/03/20 1658 99 %     Weight 03/03/20 1652 121 lb (54.9 kg)     Height 03/03/20 1652 5\' 1"  (1.549 m)     Head Circumference --      Peak Flow --      Pain Score 03/03/20 1652 4     Pain Loc --      Pain Edu? --      Excl. in [place]? --      Constitutional: Alert and oriented. Well appearing and in no acute distress. Eyes: Conjunctivae are normal. PERRL. EOMI. Head: Atraumatic. ENT:      Ears:       Nose: No congestion/rhinnorhea.      Mouth/Throat: Mucous membranes are moist.  Posterior pharynx is erythematous without tonsillar exudate.  Small ulcerations are visualized.  No tonsillar hypertrophy.  Uvula is midline. Neck: No stridor.  No cervical spine tenderness to palpation. Hematological/Lymphatic/Immunilogical: Palpable cervical lymphadenopathy.  Cardiovascular:  Normal rate, regular rhythm. Normal S1 and S2.  Good peripheral circulation. Respiratory: Normal respiratory effort without tachypnea or retractions. Lungs CTAB. Good air entry to the bases with no decreased or absent breath sounds Gastrointestinal: Bowel sounds x 4 quadrants. Soft and nontender to palpation. No guarding or rigidity. No distention. Musculoskeletal: Full range of motion to all extremities. No obvious deformities noted Neurologic:  Normal for age. No gross focal neurologic deficits are appreciated.  Skin:  Skin is warm, dry and intact. No rash noted. Psychiatric: Mood and affect are normal for age. Speech and behavior are normal.    ____________________________________________   LABS (all labs ordered are listed, but only abnormal results are displayed)  Labs Reviewed  GROUP A STREP BY PCR   ____________________________________________  EKG   ____________________________________________  RADIOLOGY   No results found.  ____________________________________________    PROCEDURES  Procedure(s) performed:     Procedures     Medications - No data to display   ____________________________________________   INITIAL IMPRESSION / ASSESSMENT AND PLAN / ED COURSE  Pertinent labs & imaging results that were available during my care of the patient were reviewed by me and considered in my medical decision making (see chart for details).      Assessment and plan Viral pharyngitis 19 year old female presents to the emergency department with pharyngitis and low-grade fever for the past 4 days.  Vital signs were reassuring in triage.  On physical exam, posterior pharynx was erythematous with small ulcerations visualized along the posterior pharynx.  Group A strep testing was negative.  Patient declined COVID-19 testing.  Unspecified viral pharyngitis is likely at this time.  Rest and hydration were encouraged at home.  Tylenol was recommended for discomfort.  Fetal heart tones were assessed at triage.  Return precautions were given to return with new or worsening symptoms.   ____________________________________________  FINAL CLINICAL IMPRESSION(S) / ED DIAGNOSES  Final diagnoses:  Pharyngitis, unspecified etiology      NEW MEDICATIONS STARTED DURING THIS VISIT:  ED Discharge Orders    None          This chart was dictated using voice recognition software/Dragon. Despite best efforts to proofread, errors can occur which can change the meaning. Any change was purely unintentional.     Orvil Feil, PA-C 03/03/20 2043    Concha Se, MD 03/03/20 2108

## 2020-03-09 ENCOUNTER — Encounter: Payer: Self-pay | Admitting: Obstetrics & Gynecology

## 2020-03-09 ENCOUNTER — Encounter: Payer: Medicaid Other | Admitting: Obstetrics & Gynecology

## 2020-03-09 ENCOUNTER — Ambulatory Visit (INDEPENDENT_AMBULATORY_CARE_PROVIDER_SITE_OTHER): Payer: Medicaid Other | Admitting: Obstetrics & Gynecology

## 2020-03-09 ENCOUNTER — Other Ambulatory Visit: Payer: Self-pay

## 2020-03-09 VITALS — BP 120/80 | Wt 128.0 lb

## 2020-03-09 DIAGNOSIS — Z131 Encounter for screening for diabetes mellitus: Secondary | ICD-10-CM

## 2020-03-09 DIAGNOSIS — Z3402 Encounter for supervision of normal first pregnancy, second trimester: Secondary | ICD-10-CM

## 2020-03-09 DIAGNOSIS — Z3A23 23 weeks gestation of pregnancy: Secondary | ICD-10-CM

## 2020-03-09 LAB — POCT URINALYSIS DIPSTICK OB
Glucose, UA: NEGATIVE
POC,PROTEIN,UA: NEGATIVE

## 2020-03-09 NOTE — Addendum Note (Signed)
Addended by: Cornelius Moras D on: 03/09/2020 11:12 AM   Modules accepted: Orders

## 2020-03-09 NOTE — Patient Instructions (Signed)

## 2020-03-09 NOTE — Progress Notes (Signed)
  Subjective  Fetal Movement? yes Contractions? no Leaking Fluid? no Vaginal Bleeding? no  Objective  BP 120/80   Wt 128 lb (58.1 kg)   LMP 09/18/2019 (Exact Date)   BMI 24.19 kg/m  General: NAD Pumonary: no increased work of breathing Abdomen: gravid, non-tender Extremities: no edema Psychiatric: mood appropriate, affect full  Assessment  19 y.o. G1P0 at [redacted]w[redacted]d by  07/02/2020, by Ultrasound presenting for routine prenatal visit  Plan   Problem List Items Addressed This Visit      Other   Supervision of normal first pregnancy, antepartum    Other Visit Diagnoses    [redacted] weeks gestation of pregnancy    -  Primary   Screening for diabetes mellitus       Relevant Orders   28 Week RH+Panel      pregnancy Problems (from 11/21/19 to present)    Problem Noted Resolved   Vaginal bleeding in pregnancy, first trimester 12/04/2019 by Conard Novak, MD No   Supervision of normal first pregnancy, antepartum 11/21/2019 by Tresea Mall, CNM No   Overview Addendum 02/11/2020  9:05 PM by Farrel Conners, CNM    Clinic [facility] Prenatal Labs  Dating By [redacted]w[redacted]d u/s Blood type: AB/Positive/-- (02/26 1145)   Genetic Screen NIPS: diploid XY Antibody:Negative (02/26 1145)  Anatomic [place] Normal anatomy, anterior placenta Rubella: <0.90 (02/26 1145)  NONIMMUNE  Varicella: NONIMMUNE  GTT 28 wk:  RPR: Non Reactive (02/26 1145)   Rhogam n/a HBsAg: Negative (02/26 1145)   Vaccines TDAP:                       Flu Shot: November 2020 HIV: Non Reactive (02/26 1145)   Baby Food Breast                               GBS:   Contraception  Pap: less than 21 yo  CBB  no   CS/VBAC NA   Support Person Partner Cyril          PNV, [facility] [place]       Annamarie Major, MD, FACOG [facility], [facility] 03/09/2020  11:04 AM

## 2020-03-24 ENCOUNTER — Telehealth: Payer: Self-pay

## 2020-03-24 NOTE — Telephone Encounter (Signed)
Patient was referred by [facility]. 10/2019 patient has active Medicaid. She is not eligible for [facility]. Patient is pregnant and has established OBGYN care at this time.

## 2020-04-06 ENCOUNTER — Other Ambulatory Visit: Payer: Medicaid Other

## 2020-04-06 ENCOUNTER — Ambulatory Visit (INDEPENDENT_AMBULATORY_CARE_PROVIDER_SITE_OTHER): Payer: Medicaid Other | Admitting: Advanced Practice Midwife

## 2020-04-06 ENCOUNTER — Encounter: Payer: Self-pay | Admitting: Advanced Practice Midwife

## 2020-04-06 ENCOUNTER — Other Ambulatory Visit: Payer: Self-pay

## 2020-04-06 VITALS — BP 120/80 | Wt 134.0 lb

## 2020-04-06 DIAGNOSIS — Z3A27 27 weeks gestation of pregnancy: Secondary | ICD-10-CM

## 2020-04-06 DIAGNOSIS — Z3402 Encounter for supervision of normal first pregnancy, second trimester: Secondary | ICD-10-CM

## 2020-04-06 DIAGNOSIS — Z131 Encounter for screening for diabetes mellitus: Secondary | ICD-10-CM

## 2020-04-06 NOTE — Patient Instructions (Signed)
Third Trimester of Pregnancy The third trimester is from week 28 through week 40 (months 7 through 9). The third trimester is a time when the unborn baby (fetus) is growing rapidly. At the end of the ninth month, the fetus is about 20 inches in length and weighs 6-10 pounds. Body changes during your third trimester Your body will continue to go through many changes during pregnancy. The changes vary from woman to woman. During the third trimester:  Your weight will continue to increase. You can expect to gain 25-35 pounds (11-16 kg) by the end of the pregnancy.  You may begin to get stretch marks on your hips, abdomen, and breasts.  You may urinate more often because the fetus is moving lower into your pelvis and pressing on your bladder.  You may develop or continue to have heartburn. This is caused by increased hormones that slow down muscles in the digestive tract.  You may develop or continue to have constipation because increased hormones slow digestion and cause the muscles that push waste through your intestines to relax.  You may develop hemorrhoids. These are swollen veins (varicose veins) in the rectum that can itch or be painful.  You may develop swollen, bulging veins (varicose veins) in your legs.  You may have increased body aches in the pelvis, back, or thighs. This is due to weight gain and increased hormones that are relaxing your joints.  You may have changes in your hair. These can include thickening of your hair, rapid growth, and changes in texture. Some women also have hair loss during or after pregnancy, or hair that feels dry or thin. Your hair will most likely return to normal after your baby is born.  Your breasts will continue to grow and they will continue to become tender. A yellow fluid (colostrum) may leak from your breasts. This is the first milk you are producing for your baby.  Your belly button may stick out.  You may notice more swelling in your hands,  face, or ankles.  You may have increased tingling or numbness in your hands, arms, and legs. The skin on your belly may also feel numb.  You may feel short of breath because of your expanding uterus.  You may have more problems sleeping. This can be caused by the size of your belly, increased need to urinate, and an increase in your body's metabolism.  You may notice the fetus "dropping," or moving lower in your abdomen (lightening).  You may have increased vaginal discharge.  You may notice your joints feel loose and you may have pain around your pelvic bone. What to expect at prenatal visits You will have prenatal exams every 2 weeks until week 36. Then you will have weekly prenatal exams. During a routine prenatal visit:  You will be weighed to make sure you and the baby are growing normally.  Your blood pressure will be taken.  Your abdomen will be measured to track your baby's growth.  The fetal heartbeat will be listened to.  Any test results from the previous visit will be discussed.  You may have a cervical check near your due date to see if your cervix has softened or thinned (effaced).  You will be tested for Group B streptococcus. This happens between 35 and 37 weeks. Your health care provider may ask you:  What your birth plan is.  How you are feeling.  If you are feeling the baby move.  If you have had any abnormal   symptoms, such as leaking fluid, bleeding, severe headaches, or abdominal cramping.  If you are using any tobacco products, including cigarettes, chewing tobacco, and electronic cigarettes.  If you have any questions. Other tests or screenings that may be performed during your third trimester include:  Blood tests that check for low iron levels (anemia).  Fetal testing to check the health, activity level, and growth of the fetus. Testing is done if you have certain medical conditions or if there are problems during the pregnancy.  Nonstress test  (NST). This test checks the health of your baby to make sure there are no signs of problems, such as the baby not getting enough oxygen. During this test, a belt is placed around your belly. The baby is made to move, and its heart rate is monitored during movement. What is false labor? False labor is a condition in which you feel small, irregular tightenings of the muscles in the womb (contractions) that usually go away with rest, changing position, or drinking water. These are called Braxton Hicks contractions. Contractions may last for hours, days, or even weeks before true labor sets in. If contractions come at regular intervals, become more frequent, increase in intensity, or become painful, you should see your health care provider. What are the signs of labor?  Abdominal cramps.  Regular contractions that start at 10 minutes apart and become stronger and more frequent with time.  Contractions that start on the top of the uterus and spread down to the lower abdomen and back.  Increased pelvic pressure and dull back pain.  A watery or bloody mucus discharge that comes from the vagina.  Leaking of amniotic fluid. This is also known as your "water breaking." It could be a slow trickle or a gush. Let your health care provider know if it has a color or strange odor. If you have any of these signs, call your health care provider right away, even if it is before your due date. Follow these instructions at home: Medicines  Follow your health care provider's instructions regarding medicine use. Specific medicines may be either safe or unsafe to take during pregnancy.  Take a prenatal vitamin that contains at least 600 micrograms (mcg) of folic acid.  If you develop constipation, try taking a stool softener if your health care provider approves. Eating and drinking   Eat a balanced diet that includes fresh fruits and vegetables, whole grains, good sources of protein such as meat, eggs, or tofu,  and low-fat dairy. Your health care provider will help you determine the amount of weight gain that is right for you.  Avoid raw meat and uncooked cheese. These carry germs that can cause birth defects in the baby.  If you have low calcium intake from food, talk to your health care provider about whether you should take a daily calcium supplement.  Eat four or five small meals rather than three large meals a day.  Limit foods that are high in fat and processed sugars, such as fried and sweet foods.  To prevent constipation: ? Drink enough fluid to keep your urine clear or pale yellow. ? Eat foods that are high in fiber, such as fresh fruits and vegetables, whole grains, and beans. Activity  Exercise only as directed by your health care provider. Most women can continue their usual exercise routine during pregnancy. Try to exercise for 30 minutes at least 5 days a week. Stop exercising if you experience uterine contractions.  Avoid heavy lifting.  Do   not exercise in extreme heat or humidity, or at high altitudes.  Wear low-heel, comfortable shoes.  Practice good posture.  You may continue to have sex unless your health care provider tells you otherwise. Relieving pain and discomfort  Take frequent breaks and rest with your legs elevated if you have leg cramps or low back pain.  Take warm sitz baths to soothe any pain or discomfort caused by hemorrhoids. Use hemorrhoid cream if your health care provider approves.  Wear a good support bra to prevent discomfort from breast tenderness.  If you develop varicose veins: ? Wear support pantyhose or compression stockings as told by your healthcare provider. ? Elevate your feet for 15 minutes, 3-4 times a day. Prenatal care  Write down your questions. Take them to your prenatal visits.  Keep all your prenatal visits as told by your health care provider. This is important. Safety  Wear your seat belt at all times when driving.  Make  a list of emergency phone numbers, including numbers for family, friends, the hospital, and police and fire departments. General instructions  Avoid cat litter boxes and soil used by cats. These carry germs that can cause birth defects in the baby. If you have a cat, ask someone to clean the litter box for you.  Do not travel far distances unless it is absolutely necessary and only with the approval of your health care provider.  Do not use hot tubs, steam rooms, or saunas.  Do not drink alcohol.  Do not use any products that contain nicotine or tobacco, such as cigarettes and e-cigarettes. If you need help quitting, ask your health care provider.  Do not use any medicinal herbs or unprescribed drugs. These chemicals affect the formation and growth of the baby.  Do not douche or use tampons or scented sanitary pads.  Do not cross your legs for long periods of time.  To prepare for the arrival of your baby: ? Take prenatal classes to understand, practice, and ask questions about labor and delivery. ? Make a trial run to the hospital. ? Visit the hospital and tour the maternity area. ? Arrange for maternity or paternity leave through employers. ? Arrange for family and friends to take care of pets while you are in the hospital. ? Purchase a rear-facing car seat and make sure you know how to install it in your car. ? Pack your hospital bag. ? Prepare the baby's nursery. Make sure to remove all pillows and stuffed animals from the baby's crib to prevent suffocation.  Visit your dentist if you have not gone during your pregnancy. Use a soft toothbrush to brush your teeth and be gentle when you floss. Contact a health care provider if:  You are unsure if you are in labor or if your water has broken.  You become dizzy.  You have mild pelvic cramps, pelvic pressure, or nagging pain in your abdominal area.  You have lower back pain.  You have persistent nausea, vomiting, or  diarrhea.  You have an unusual or bad smelling vaginal discharge.  You have pain when you urinate. Get help right away if:  Your water breaks before 37 weeks.  You have regular contractions less than 5 minutes apart before 37 weeks.  You have a fever.  You are leaking fluid from your vagina.  You have spotting or bleeding from your vagina.  You have severe abdominal pain or cramping.  You have rapid weight loss or weight gain.  You have   shortness of breath with chest pain.  You notice sudden or extreme swelling of your face, hands, ankles, feet, or legs.  Your baby makes fewer than 10 movements in 2 hours.  You have severe headaches that do not go away when you take medicine.  You have vision changes. Summary  The third trimester is from week 28 through week 40, months 7 through 9. The third trimester is a time when the unborn baby (fetus) is growing rapidly.  During the third trimester, your discomfort may increase as you and your baby continue to gain weight. You may have abdominal, leg, and back pain, sleeping problems, and an increased need to urinate.  During the third trimester your breasts will keep growing and they will continue to become tender. A yellow fluid (colostrum) may leak from your breasts. This is the first milk you are producing for your baby.  False labor is a condition in which you feel small, irregular tightenings of the muscles in the womb (contractions) that eventually go away. These are called Braxton Hicks contractions. Contractions may last for hours, days, or even weeks before true labor sets in.  Signs of labor can include: abdominal cramps; regular contractions that start at 10 minutes apart and become stronger and more frequent with time; watery or bloody mucus discharge that comes from the vagina; increased pelvic pressure and dull back pain; and leaking of amniotic fluid. This information is not intended to replace advice given to you by your  health care provider. Make sure you discuss any questions you have with your health care provider. Document Revised: 01/02/2019 Document Reviewed: 10/17/2016 Elsevier Patient Education  2020 [employer]  

## 2020-04-06 NOTE — Progress Notes (Signed)
  Routine Prenatal Care Visit  Subjective  Catherine Dunn is a 19 y.o. G1P0 at [redacted]w[redacted]d being seen today for ongoing prenatal care.  She is currently monitored for the following issues for this low-risk pregnancy and has Supervision of normal first pregnancy, antepartum and Vaginal bleeding in pregnancy, first trimester on their problem list.  ----------------------------------------------------------------------------------- Patient reports no complaints.   Contractions: Not present. Vag. Bleeding: None.  Movement: Present. Leaking Fluid denies.  ----------------------------------------------------------------------------------- The following portions of the patient's history were reviewed and updated as appropriate: allergies, current medications, past family history, past medical history, past social history, past surgical history and problem list. Problem list updated.  Objective  Blood pressure 120/80, weight 134 lb (60.8 kg), last menstrual period 09/18/2019. Pregravid weight 118 lb (53.5 kg) Total Weight Gain 16 lb (7.258 kg) Urinalysis: Urine Protein    Urine Glucose    Fetal Status: Fetal Heart Rate (bpm): 142 Fundal Height: 28 cm Movement: Present     General:  Alert, oriented and cooperative. Patient is in no acute distress.  Skin: Skin is warm and dry. No rash noted.   Cardiovascular: Normal heart rate noted  Respiratory: Normal respiratory effort, no problems with respiration noted  Abdomen: Soft, gravid, appropriate for gestational age. Pain/Pressure: Absent     Pelvic:  Cervical exam deferred        Extremities: Normal range of motion.  Edema: None  Mental Status: Normal mood and affect. Normal behavior. Normal judgment and thought content.   Assessment   [name] at [redacted]w[redacted]d by  07/02/2020, by Ultrasound presenting for routine prenatal visit  Plan   pregnancy Problems (from 11/21/19 to present)    Problem Noted Resolved   Vaginal bleeding in pregnancy, first trimester  12/04/2019 by Conard Novak, MD No   Supervision of normal first pregnancy, antepartum 11/21/2019 by Tresea Mall, CNM No   Overview Addendum 02/11/2020  9:05 PM by Farrel Conners, CNM    [facility]inic [facility] Prenatal Labs  Dating By [redacted]w[redacted]d u/s Blood type: AB/Positive/-- (02/26 1145)   Genetic Screen 1 Screen:    AFP:     Quad:     NIPS: diploid XY Antibody:Negative (02/26 1145)  Anatomic [place] Normal anatomy, anterior placenta Rubella: <0.90 (02/26 1145)  NONIMMUNE  Varicella: NONIMMUNE  GTT 28 wk:  RPR: Non Reactive (02/26 1145)   Rhogam  HBsAg: Negative (02/26 1145)   Vaccines TDAP:                       Flu Shot: November 2020 HIV: Non Reactive (02/26 1145)   Baby Food Breast                               GBS:   Contraception Reviewed progesterone only and non hormonal options Pap: less than 21 yo  CBB     CS/VBAC NA   Support Person Partner Apolinar Junes          Previous Version    28 week labs today Reminded to take breastfeeding class Reviewed progesterone only birth control options   Preterm labor symptoms and general obstetric precautions including but not limited to vaginal bleeding, contractions, leaking of fluid and fetal movement were reviewed in detail with the patient. Please refer to After Visit Summary for other counseling recommendations.   Return in about 2 weeks (around 04/20/2020) for rob.  Tresea Mall, CNM 04/06/2020 9:30 AM

## 2020-04-07 LAB — 28 WEEK RH+PANEL
Basophils Absolute: 0 10*3/uL (ref 0.0–0.2)
Basos: 0 %
EOS (ABSOLUTE): 0.1 10*3/uL (ref 0.0–0.4)
Eos: 1 %
Gestational Diabetes Screen: 127 mg/dL (ref 65–139)
HIV Screen 4th Generation wRfx: NONREACTIVE
Hematocrit: 35 % (ref 34.0–46.6)
Hemoglobin: 11.6 g/dL (ref 11.1–15.9)
Immature Grans (Abs): 0.1 10*3/uL (ref 0.0–0.1)
Immature Granulocytes: 1 %
Lymphocytes Absolute: 1.7 10*3/uL (ref 0.7–3.1)
Lymphs: 24 %
MCH: 31.3 pg (ref 26.6–33.0)
MCHC: 33.1 g/dL (ref 31.5–35.7)
MCV: 94 fL (ref 79–97)
Monocytes Absolute: 0.7 10*3/uL (ref 0.1–0.9)
Monocytes: 10 %
Neutrophils Absolute: 4.4 10*3/uL (ref 1.4–7.0)
Neutrophils: 64 %
Platelets: 198 10*3/uL (ref 150–450)
RBC: 3.71 x10E6/uL — ABNORMAL LOW (ref 3.77–5.28)
RDW: 13.1 % (ref 11.7–15.4)
RPR Ser Ql: NONREACTIVE
WBC: 7 10*3/uL (ref 3.4–10.8)

## 2020-04-20 ENCOUNTER — Encounter: Payer: Self-pay | Admitting: Advanced Practice Midwife

## 2020-04-20 ENCOUNTER — Other Ambulatory Visit: Payer: Self-pay

## 2020-04-20 ENCOUNTER — Ambulatory Visit (INDEPENDENT_AMBULATORY_CARE_PROVIDER_SITE_OTHER): Payer: Medicaid Other | Admitting: Advanced Practice Midwife

## 2020-04-20 VITALS — BP 110/70 | Ht 61.0 in | Wt 139.4 lb

## 2020-04-20 DIAGNOSIS — Z3400 Encounter for supervision of normal first pregnancy, unspecified trimester: Secondary | ICD-10-CM

## 2020-04-20 DIAGNOSIS — Z3A29 29 weeks gestation of pregnancy: Secondary | ICD-10-CM

## 2020-04-20 NOTE — Progress Notes (Signed)
  Routine Prenatal Care Visit  Subjective  Catherine Dunn is a 19 y.o. G1P0 at [redacted]w[redacted]d being seen today for ongoing prenatal care.  She is currently monitored for the following issues for this low-risk pregnancy and has Supervision of normal first pregnancy, antepartum and Vaginal bleeding in pregnancy, first trimester on their problem list.  ----------------------------------------------------------------------------------- Patient reports a lower abdominal cramp this morning that lasted about 5 minutes and nothing since. She reports adequate hydration.   Contractions: Not present. Vag. Bleeding: None.  Movement: Present. Leaking Fluid denies.  ----------------------------------------------------------------------------------- The following portions of the patient's history were reviewed and updated as appropriate: allergies, current medications, past family history, past medical history, past social history, past surgical history and problem list. Problem list updated.  Objective  Blood pressure 110/70, height 5\' 1"  (1.549 m), weight 139 lb 6.4 oz (63.2 kg), last menstrual period 09/18/2019. Pregravid weight 118 lb (53.5 kg) Total Weight Gain 21 lb 6.4 oz (9.707 kg) Urinalysis: Urine Protein    Urine Glucose    Fetal Status: Fetal Heart Rate (bpm): 138 Fundal Height: 29 cm Movement: Present     General:  Alert, oriented and cooperative. Patient is in no acute distress.  Skin: Skin is warm and dry. No rash noted.   Cardiovascular: Normal heart rate noted  Respiratory: Normal respiratory effort, no problems with respiration noted  Abdomen: Soft, gravid, appropriate for gestational age. Pain/Pressure: Absent     Pelvic:  Cervical exam deferred        Extremities: Normal range of motion.     Mental Status: Normal mood and affect. Normal behavior. Normal judgment and thought content.   Assessment   19 y.o. G1P0 at [redacted]w[redacted]d by  07/02/2020, by Ultrasound presenting for routine prenatal  visit  Plan   pregnancy Problems (from 11/21/19 to present)    Problem Noted Resolved   Vaginal bleeding in pregnancy, first trimester 12/04/2019 by 02/03/2020, MD No   Supervision of normal first pregnancy, antepartum 11/21/2019 by 11/23/2019, CNM No   Overview Addendum 02/11/2020  9:05 PM by 02/13/2020, CNM    [facility]inic [facility] Prenatal Labs  Dating By [redacted]w[redacted]d u/s Blood type: AB/Positive/-- (02/26 1145)   Genetic Screen 1 Screen:    AFP:     Quad:     NIPS: diploid XY Antibody:Negative (02/26 1145)  Anatomic 04-14-1985 Normal anatomy, anterior placenta Rubella: <0.90 (02/26 1145)  NONIMMUNE  Varicella: NONIMMUNE  GTT 28 wk:  RPR: Non Reactive (02/26 1145)   Rhogam  HBsAg: Negative (02/26 1145)   Vaccines TDAP:                       Flu Shot: November 2020 HIV: Non Reactive (02/26 1145)   Baby Food Breast                               GBS:   Contraception  Pap: less than 21 yo  CBB     CS/VBAC NA   Support Person Partner 04-14-1985          Previous Version       Preterm labor symptoms and general obstetric precautions including but not limited to vaginal bleeding, contractions, leaking of fluid and fetal movement were reviewed in detail with the patient.   Return in about 2 weeks (around 05/04/2020) for rob.  07/04/2020, CNM 04/20/2020 11:44 AM

## 2020-05-06 ENCOUNTER — Ambulatory Visit (INDEPENDENT_AMBULATORY_CARE_PROVIDER_SITE_OTHER): Payer: Medicaid Other | Admitting: Certified Nurse Midwife

## 2020-05-06 ENCOUNTER — Other Ambulatory Visit: Payer: Self-pay

## 2020-05-06 VITALS — BP 109/80 | Wt 142.0 lb

## 2020-05-06 DIAGNOSIS — Z23 Encounter for immunization: Secondary | ICD-10-CM | POA: Diagnosis not present

## 2020-05-06 DIAGNOSIS — Z3A31 31 weeks gestation of pregnancy: Secondary | ICD-10-CM

## 2020-05-06 DIAGNOSIS — Z3403 Encounter for supervision of normal first pregnancy, third trimester: Secondary | ICD-10-CM

## 2020-05-06 DIAGNOSIS — Z3400 Encounter for supervision of normal first pregnancy, unspecified trimester: Secondary | ICD-10-CM

## 2020-05-06 LAB — POCT URINALYSIS DIPSTICK OB
Glucose, UA: NEGATIVE
POC,PROTEIN,UA: NEGATIVE

## 2020-05-06 NOTE — Progress Notes (Signed)
ROB at 31wk6d: Doing well. Baby active. No vaginal bleeding, leakage of water or contractions  Exam: FH 32cm, FHT 140. BP 109/80  Weight 142# (TWG 24#)  A: IUP at 31wk6d S=D  P: OB in 2 weeks Discussed 24 hour rooming in after delivery Explained signs and symptoms of preterm labor BT consent signed TDAP today.   Farrel Conners, CNM

## 2020-05-06 NOTE — Progress Notes (Signed)
No concerns.rj 

## 2020-05-06 NOTE — Addendum Note (Signed)
Addended by: Loran Senters D on: 05/06/2020 10:11 AM   Modules accepted: Orders

## 2020-05-21 ENCOUNTER — Ambulatory Visit (INDEPENDENT_AMBULATORY_CARE_PROVIDER_SITE_OTHER): Payer: Medicaid Other | Admitting: Obstetrics

## 2020-05-21 ENCOUNTER — Emergency Department: Payer: Medicaid Other

## 2020-05-21 ENCOUNTER — Encounter: Payer: Self-pay | Admitting: Emergency Medicine

## 2020-05-21 ENCOUNTER — Emergency Department
Admission: EM | Admit: 2020-05-21 | Discharge: 2020-05-22 | Disposition: A | Discharge status: Home / Self Care | Payer: Medicaid Other | Attending: Emergency Medicine | Admitting: Emergency Medicine

## 2020-05-21 ENCOUNTER — Other Ambulatory Visit: Payer: Self-pay

## 2020-05-21 VITALS — BP 114/80 | Wt 146.0 lb

## 2020-05-21 DIAGNOSIS — R0602 Shortness of breath: Secondary | ICD-10-CM | POA: Insufficient documentation

## 2020-05-21 DIAGNOSIS — Z5321 Procedure and treatment not carried out due to patient leaving prior to being seen by health care provider: Secondary | ICD-10-CM | POA: Diagnosis not present

## 2020-05-21 DIAGNOSIS — R079 Chest pain, unspecified: Secondary | ICD-10-CM | POA: Diagnosis present

## 2020-05-21 DIAGNOSIS — O99893 Other specified diseases and conditions complicating puerperium: Secondary | ICD-10-CM | POA: Diagnosis not present

## 2020-05-21 DIAGNOSIS — Z3A34 34 weeks gestation of pregnancy: Secondary | ICD-10-CM | POA: Insufficient documentation

## 2020-05-21 DIAGNOSIS — Z3400 Encounter for supervision of normal first pregnancy, unspecified trimester: Secondary | ICD-10-CM

## 2020-05-21 DIAGNOSIS — Z3403 Encounter for supervision of normal first pregnancy, third trimester: Secondary | ICD-10-CM

## 2020-05-21 LAB — CBC
HCT: 32.1 % — ABNORMAL LOW (ref 36.0–46.0)
Hemoglobin: 10.9 g/dL — ABNORMAL LOW (ref 12.0–15.0)
MCH: 29.5 pg (ref 26.0–34.0)
MCHC: 34 g/dL (ref 30.0–36.0)
MCV: 86.8 fL (ref 80.0–100.0)
Platelets: 184 10*3/uL (ref 150–400)
RBC: 3.7 MIL/uL — ABNORMAL LOW (ref 3.87–5.11)
RDW: 12.1 % (ref 11.5–15.5)
WBC: 8.3 10*3/uL (ref 4.0–10.5)
nRBC: 0 % (ref 0.0–0.2)

## 2020-05-21 LAB — POCT URINALYSIS DIPSTICK OB
Glucose, UA: NEGATIVE
POC,PROTEIN,UA: NEGATIVE

## 2020-05-21 NOTE — ED Triage Notes (Signed)
Patient states that she is [redacted] weeks pregnant. Patient with complaint of shortness of breath and chest discomfort times two days. Patient states that it became worse today.

## 2020-05-21 NOTE — Progress Notes (Signed)
No concerns.rj 

## 2020-05-21 NOTE — Progress Notes (Signed)
  Routine Prenatal Care Visit  Subjective  Catherine Dunn is a 19 y.o. G1P0 at [redacted]w[redacted]d being seen today for ongoing prenatal care.  She is currently monitored for the following issues for this low-risk pregnancy and has Supervision of normal first pregnancy, antepartum and Vaginal bleeding in pregnancy, first trimester on their problem list.  ----------------------------------------------------------------------------------- Patient reports no complaints.    .  .   Pincus Large Fluid denies.  ----------------------------------------------------------------------------------- The following portions of the patient's history were reviewed and updated as appropriate: allergies, current medications, past family history, past medical history, past social history, past surgical history and problem list. Problem list updated.  Objective  Blood pressure 114/80, weight 146 lb (66.2 kg), last menstrual period 09/18/2019. Pregravid weight 118 lb (53.5 kg) Total Weight Gain 28 lb (12.7 kg) Urinalysis: Urine Protein Negative  Urine Glucose Negative  Fetal Status:           General:  Alert, oriented and cooperative. Patient is in no acute distress.  Skin: Skin is warm and dry. No rash noted.   Cardiovascular: Normal heart rate noted  Respiratory: Normal respiratory effort, no problems with respiration noted  Abdomen: Soft, gravid, appropriate for gestational age.       Pelvic:  Cervical exam deferred        Extremities: Normal range of motion.     Mental Status: Normal mood and affect. Normal behavior. Normal judgment and thought content.   Assessment   19 y.o. G1P0 at [redacted]w[redacted]d by  07/02/2020, by Ultrasound presenting for routine prenatal visit  Plan   pregnancy Problems (from 11/21/19 to present)    Problem Noted Resolved   Vaginal bleeding in pregnancy, first trimester 12/04/2019 by Conard Novak, MD No   Supervision of normal first pregnancy, antepartum 11/21/2019 by Tresea Mall, CNM No    Overview Addendum 05/06/2020  9:42 AM by Farrel Conners, CNM    Clinic [facility] Prenatal Labs  Dating By [redacted]w[redacted]d u/s Blood type: AB/Positive/-- (02/26 1145)   Genetic Screen 1 Screen:    AFP:     Quad:     NIPS: diploid XY Antibody:Negative (02/26 1145)  Anatomic [place] Normal anatomy, anterior placenta Rubella: <0.90 (02/26 1145)  NONIMMUNE  Varicella: NONIMMUNE  GTT 28 wk: 127 RPR: Non Reactive (02/26 1145)   Rhogam  HBsAg: Negative (02/26 1145)   Vaccines TDAP:                       Flu Shot: November 2020 HIV: Non Reactive (02/26 1145)   Baby Food Breast                               GBS:   Contraception  Pap: less than 21 yo  CBB     CS/VBAC NA   Support Person Partner Apolinar Junes          Previous Version       Preterm labor symptoms and general obstetric precautions including but not limited to vaginal bleeding, contractions, leaking of fluid and fetal movement were reviewed in detail with the patient. Please refer to After Visit Summary for other counseling recommendations.   Return in about 2 weeks (around 06/04/2020) for return OB and GBSculture, labs.  Mirna Mires, CNM  05/21/2020 10:30 AM

## 2020-05-22 LAB — BASIC METABOLIC PANEL
Anion gap: 10 (ref 5–15)
BUN: <5 mg/dL — ABNORMAL LOW (ref 6–20)
CO2: 26 mmol/L (ref 22–32)
Calcium: 8.5 mg/dL — ABNORMAL LOW (ref 8.9–10.3)
Chloride: 101 mmol/L (ref 98–111)
Creatinine, Ser: 0.51 mg/dL (ref 0.44–1.00)
GFR calc Af Amer: >60 mL/min (ref >60)
GFR calc non Af Amer: >60 mL/min (ref >60)
Glucose, Bld: 123 mg/dL — ABNORMAL HIGH (ref 70–99)
Potassium: 3.6 mmol/L (ref 3.5–5.1)
Sodium: 137 mmol/L (ref 135–145)

## 2020-05-22 LAB — HEPATIC FUNCTION PANEL
ALT: 9 U/L (ref 0–44)
AST: 14 U/L — ABNORMAL LOW (ref 15–41)
Albumin: 3 g/dL — ABNORMAL LOW (ref 3.5–5.0)
Alkaline Phosphatase: 85 U/L (ref 38–126)
Bilirubin, Direct: <0.1 mg/dL (ref 0.0–0.2)
Total Bilirubin: 0.7 mg/dL (ref 0.3–1.2)
Total Protein: 6.8 g/dL (ref 6.5–8.1)

## 2020-05-22 LAB — TROPONIN I (HIGH SENSITIVITY): Troponin I (High Sensitivity): 3 ng/L (ref <18)

## 2020-05-22 NOTE — ED Notes (Signed)
Called for vital sign recheck. No answer, pt not visualized in lobby or outside

## 2020-05-22 NOTE — ED Notes (Signed)
Called for vital sign recheck, no answer, pt not visualized in lobby or outside

## 2020-05-22 NOTE — ED Notes (Signed)
Pt called for vital sign recheck. No answer. Pt not visualized in the lobby or outside.

## 2020-06-04 ENCOUNTER — Other Ambulatory Visit: Payer: Self-pay

## 2020-06-04 ENCOUNTER — Ambulatory Visit (INDEPENDENT_AMBULATORY_CARE_PROVIDER_SITE_OTHER): Payer: Medicaid Other | Admitting: Obstetrics

## 2020-06-04 VITALS — BP 100/60 | Wt 151.0 lb

## 2020-06-04 DIAGNOSIS — Z3400 Encounter for supervision of normal first pregnancy, unspecified trimester: Secondary | ICD-10-CM

## 2020-06-04 DIAGNOSIS — Z3A36 36 weeks gestation of pregnancy: Secondary | ICD-10-CM

## 2020-06-04 DIAGNOSIS — Z3403 Encounter for supervision of normal first pregnancy, third trimester: Secondary | ICD-10-CM

## 2020-06-04 LAB — POCT URINALYSIS DIPSTICK OB
Glucose, UA: NEGATIVE
POC,PROTEIN,UA: NEGATIVE

## 2020-06-04 NOTE — Progress Notes (Signed)
No concerns.rj 

## 2020-06-04 NOTE — Progress Notes (Signed)
  Routine Prenatal Care Visit  Subjective  Catherine Dunn is a 19 y.o. G1P0 at [redacted]w[redacted]d being seen today for ongoing prenatal care.  She is currently monitored for the following issues for this low-risk pregnancy and has Supervision of normal first pregnancy, antepartum and Vaginal bleeding in pregnancy, first trimester on their problem list.  ----------------------------------------------------------------------------------- Patient reports no complaints.  She is eager for labor.  .  .   Pincus Large Fluid denies.  ----------------------------------------------------------------------------------- The following portions of the patient's history were reviewed and updated as appropriate: allergies, current medications, past family history, past medical history, past social history, past surgical history and problem list. Problem list updated.  Objective  Blood pressure 100/60, weight 151 lb (68.5 kg), last menstrual period 09/18/2019. Pregravid weight 118 lb (53.5 kg) Total Weight Gain 33 lb (15 kg) Urinalysis: Urine Protein Negative  Urine Glucose Negative  Fetal Status:           General:  Alert, oriented and cooperative. Patient is in no acute distress.  Skin: Skin is warm and dry. No rash noted.   Cardiovascular: Normal heart rate noted  Respiratory: Normal respiratory effort, no problems with respiration noted  Abdomen: Soft, gravid, appropriate for gestational age.       Pelvic:  Cervical exam performed      closed/long/-3  Extremities: Normal range of motion.     Mental Status: Normal mood and affect. Normal behavior. Normal judgment and thought content.   Assessment   19 y.o. G1P0 at [redacted]w[redacted]d by  07/02/2020, by Ultrasound presenting for routine prenatal visit  Plan   pregnancy Problems (from 11/21/19 to present)    Problem Noted Resolved   Vaginal bleeding in pregnancy, first trimester 12/04/2019 by Conard Novak, MD No   Supervision of normal first pregnancy, antepartum  11/21/2019 by Tresea Mall, CNM No   Overview Addendum 05/06/2020  9:42 AM by Farrel Conners, CNM    [facility]inic [facility] Prenatal Labs  Dating By 104w5d u/s Blood type: AB/Positive/-- (02/26 1145)   Genetic Screen 1 Screen:    AFP:     Quad:     NIPS: diploid XY Antibody:Negative (02/26 1145)  Anatomic [place] Normal anatomy, anterior placenta Rubella: <0.90 (02/26 1145)  NONIMMUNE  Varicella: NONIMMUNE  GTT 28 wk: 127 RPR: Non Reactive (02/26 1145)   Rhogam  HBsAg: Negative (02/26 1145)   Vaccines TDAP:                       Flu Shot: November 2020 HIV: Non Reactive (02/26 1145)   Baby Food Breast                               GBS:   Contraception  Pap: less than 21 yo  CBB     CS/VBAC NA   Support Person Partner Apolinar Junes          Previous Version       Term labor symptoms and general obstetric precautions including but not limited to vaginal bleeding, contractions, leaking of fluid and fetal movement were reviewed in detail with the patient. Please refer to After Visit Summary for other counseling recommendations.  Breastfeeding discussed today, and we reviewed the benefits to her and the baby. Cultures and GBS retrieved today.  Return in about 1 week (around 06/11/2020) for return OB.  Mirna Mires, CNM  06/04/2020 9:47 AM

## 2020-06-09 ENCOUNTER — Encounter: Payer: Self-pay | Admitting: Obstetrics and Gynecology

## 2020-06-09 ENCOUNTER — Ambulatory Visit (INDEPENDENT_AMBULATORY_CARE_PROVIDER_SITE_OTHER): Payer: Medicaid Other | Admitting: Obstetrics and Gynecology

## 2020-06-09 ENCOUNTER — Other Ambulatory Visit: Payer: Self-pay

## 2020-06-09 VITALS — BP 100/68 | Ht 61.0 in | Wt 149.6 lb

## 2020-06-09 DIAGNOSIS — Z3A36 36 weeks gestation of pregnancy: Secondary | ICD-10-CM | POA: Diagnosis not present

## 2020-06-09 DIAGNOSIS — Z3400 Encounter for supervision of normal first pregnancy, unspecified trimester: Secondary | ICD-10-CM

## 2020-06-09 LAB — POCT URINALYSIS DIPSTICK OB
Glucose, UA: NEGATIVE
POC,PROTEIN,UA: NEGATIVE

## 2020-06-09 NOTE — Patient Instructions (Signed)
Vaginal Delivery  Vaginal delivery means that you give birth by pushing your baby out of your birth canal (vagina). A team of health care providers will help you before, during, and after vaginal delivery. Birth experiences are unique for every woman and every pregnancy, and birth experiences vary depending on where you choose to give birth. What happens when I arrive at the birth center or hospital? Once you are in labor and have been admitted into the hospital or birth center, your health care provider may:  Review your pregnancy history and any concerns that you have.  Insert an IV into one of your veins. This may be used to give you fluids and medicines.  Check your blood pressure, pulse, temperature, and heart rate (vital signs).  Check whether your bag of water (amniotic sac) has broken (ruptured).  Talk with you about your birth plan and discuss pain control options. Monitoring Your health care provider may monitor your contractions (uterine monitoring) and your baby's heart rate (fetal monitoring). You may need to be monitored:  Often, but not continuously (intermittently).  All the time or for long periods at a time (continuously). Continuous monitoring may be needed if: ? You are taking certain medicines, such as medicine to relieve pain or make your contractions stronger. ? You have pregnancy or labor complications. Monitoring may be done by:  Placing a special stethoscope or a handheld monitoring device on your abdomen to check your baby's heartbeat and to check for contractions.  Placing monitors on your abdomen (external monitors) to record your baby's heartbeat and the frequency and length of contractions.  Placing monitors inside your uterus through your vagina (internal monitors) to record your baby's heartbeat and the frequency, length, and strength of your contractions. Depending on the type of monitor, it may remain in your uterus or on your baby's head until  birth.  Telemetry. This is a type of continuous monitoring that can be done with external or internal monitors. Instead of having to stay in bed, you are able to move around during telemetry. Physical exam Your health care provider may perform frequent physical exams. This may include:  Checking how and where your baby is positioned in your uterus.  Checking your cervix to determine: ? Whether it is thinning out (effacing). ? Whether it is opening up (dilating). What happens during labor and delivery?  Normal labor and delivery is divided into the following three stages: Stage 1  This is the longest stage of labor.  This stage can last for hours or days.  Throughout this stage, you will feel contractions. Contractions generally feel mild, infrequent, and irregular at first. They get stronger, more frequent (about every 2-3 minutes), and more regular as you move through this stage.  This stage ends when your cervix is completely dilated to 4 inches (10 cm) and completely effaced. Stage 2  This stage starts once your cervix is completely effaced and dilated and lasts until the delivery of your baby.  This stage may last from 20 minutes to 2 hours.  This is the stage where you will feel an urge to push your baby out of your vagina.  You may feel stretching and burning pain, especially when the widest part of your baby's head passes through the vaginal opening (crowning).  Once your baby is delivered, the umbilical cord will be clamped and cut. This usually occurs after waiting a period of 1-2 minutes after delivery.  Your baby will be placed on your bare chest (  skin-to-skin contact) in an upright position and covered with a warm blanket. Watch your baby for feeding cues, like rooting or sucking, and help the baby to your breast for his or her first feeding. Stage 3  This stage starts immediately after the birth of your baby and ends after you deliver the placenta.  This stage may  take anywhere from 5 to 30 minutes.  After your baby has been delivered, you will feel contractions as your body expels the placenta and your uterus contracts to control bleeding. What can I expect after labor and delivery?  After labor is over, you and your baby will be monitored closely until you are ready to go home to ensure that you are both healthy. Your health care team will teach you how to care for yourself and your baby.  You and your baby will stay in the same room (rooming in) during your hospital stay. This will encourage early bonding and successful breastfeeding.  You may continue to receive fluids and medicines through an IV.  Your uterus will be checked and massaged regularly (fundal massage).  You will have some soreness and pain in your abdomen, vagina, and the area of skin between your vaginal opening and your anus (perineum).  If an incision was made near your vagina (episiotomy) or if you had some vaginal tearing during delivery, cold compresses may be placed on your episiotomy or your tear. This helps to reduce pain and swelling.  You may be given a squirt bottle to use instead of wiping when you go to the bathroom. To use the squirt bottle, follow these steps: ? Before you urinate, fill the squirt bottle with warm water. Do not use hot water. ? After you urinate, while you are sitting on the toilet, use the squirt bottle to rinse the area around your urethra and vaginal opening. This rinses away any urine and blood. ? Fill the squirt bottle with clean water every time you use the bathroom.  It is normal to have vaginal bleeding after delivery. Wear a sanitary pad for vaginal bleeding and discharge. Summary  Vaginal delivery means that you will give birth by pushing your baby out of your birth canal (vagina).  Your health care provider may monitor your contractions (uterine monitoring) and your baby's heart rate (fetal monitoring).  Your health care provider may  perform a physical exam.  Normal labor and delivery is divided into three stages.  After labor is over, you and your baby will be monitored closely until you are ready to go home. This information is not intended to replace advice given to you by your health care provider. Make sure you discuss any questions you have with your health care provider. Document Revised: 10/16/2017 Document Reviewed: 10/16/2017 Elsevier Patient Education  2020 Else[employer] Inc[employer]  Pain Relief During Labor and Delivery Many things can cause pain during labor and delivery, including:  Pressure on bones and ligaments due to the baby moving through the pelvis.  Stretching of tissues due to the baby moving through the birth canal.  Muscle tension due to anxiety or nervousness.  The uterus tightening (contracting) and relaxing to help move the baby. There are many ways to deal with the pain of labor and delivery. They include:  Taking prenatal classes. Taking these classes helps you know what to expect during your baby's birth. What you learn will increase your confidence and decrease your anxiety.  Practicing relaxation techniques or doing relaxing activities, such as: ? Focused breathing. ?   Meditation. ? Visualization. ? Aroma therapy. ? Listening to your favorite music. ? Hypnosis.  Taking a warm shower or bath (hydrotherapy). This may: ? Provide comfort and relaxation. ? Lessen your perception of pain. ? Decrease the amount of pain medicine needed. ? Decrease the length of labor.  Getting a massage or counterpressure on your back.  Applying warm packs or ice packs.  Changing positions often, moving around, or using a birthing ball.  Getting: ? Pain medicine through an IV or injection into a muscle. ? Pain medicine inserted into your spinal column. ? Injections of sterile water just under the skin on your lower back (intradermal injections). ? Laughing gas (nitrous oxide). Discuss your pain control  options with your health care provider during your prenatal visits. Explore the options offered by your hospital or birth center. What kinds of medicine are available? There are two kinds of medicines that can be used to relieve pain during labor and delivery:  Analgesics. These medicines decrease pain without causing you to lose feeling or the ability to move your muscles.  Anesthetics. These medicines block feeling in the body and can decrease your ability to move freely. Both of these kinds of medicine can cause minor side effects, such as nausea, trouble concentrating, and sleepiness. They can also decrease the baby's heart rate before birth and affect the baby's breathing rate after birth. For this reason, health care providers are careful about when and how much medicine is given. What are specific medicines and procedures that provide pain relief? Local Anesthetics Local anesthetics are used to numb a small area of the body. They may be used along with another kind of anesthetic or used to numb the nerves of the vagina, cervix, and perineum during the second stage of labor. General Anesthetics General anesthetics cause you to lose consciousness so you do not feel pain. They are usually only used for an emergency cesarean delivery. General anesthetics are given through an IV tube and a mask. Pudendal Block A pudendal block is a form of local anesthetic. It may be used to relieve the pain associated with pushing or stretching of the perineum at the time of delivery or to further numb the perineum. A pudendal block is done by injecting numbing medicine through the vaginal wall into a nerve in the pelvis. Epidural Analgesia Epidural analgesia is given through a flexible IV catheter that is inserted into the lower back. Numbing medicine is delivered continuously to the area near your spinal column nerves (epidural space). After having this type of analgesia, you may be able to move your legs but  you most likely will not be able to walk. Depending on the amount of medicine given, you may lose all feeling in the lower half of your body, or you may retain some level of sensation, including the urge to push. Epidural analgesia can be used to provide pain relief for a vaginal birth. Spinal Block A spinal block is similar to epidural analgesia, but the medicine is injected into the spinal fluid instead of the epidural space. A spinal block is only given once. It starts to relieve pain quickly, but the pain relief lasts only 1-6 hours. Spinal blocks can be used for cesarean deliveries. Combined Spinal-Epidural (CSE) Block A CSE block combines the effects of a spinal block and epidural analgesia. The spinal block works quickly to block all pain. The epidural analgesia provides continuous pain relief, even after the effects of the spinal block have worn off. This information   This information is not intended to replace advice given to you by your health care provider. Make sure you discuss any questions you have with your health care provider. Document Revised: 08/24/2017 Document Reviewed: 02/02/2016 Elsevier Patient Education  2020 Elsevier Inc.   Augmentation of Labor  Augmentation of labor is when steps are taken to stimulate and strengthen contractions of the uterus during labor. This may be done when contractions have slowed down or stopped, delaying progress of labor and delivery of the baby. Before beginning augmentation of labor, your health care provider will evaluate your condition, your baby's condition, the size and position of your baby, and the size of your birth canal. What are some reasons for labor augmentation? Augmentation of labor may be needed when:  You are in labor but your contractions are weak or irregular.  You are in labor but your contractions have stopped. What methods are used for labor augmentation? Labor augmentation may be done by:  Giving medicine that stimulates  contractions (oxytocin). This is given through an IV tube that is inserted into one of your veins.  Breaking the fluid-filled sac that surrounds the fetus (amniotic sac). What are the risks associated with labor augmentation? Some risks of labor augmentation include:  Too much stimulation of the contractions, resulting in continuous, prolonged, or very strong contractions.  Increased risk of infection for you and your baby.  Tearing (rupture) of the uterus.  Breaking off (abruption) of the placenta.  Increased risk of cesarean, forceps, or vacuum delivery.  Excessive bleeding after delivery (postpartum hemorrhage).  Death of the baby (fetal death). What are some reasons for not doing labor augmentation? Augmentation of labor should not be done if:  The baby is too big for the birth canal. This can be confirmed with an ultrasound.  The umbilical cord drops in front of the baby's head or breech part (prolapsed cord).  You have had a cesarean delivery and you had a vertical incision or you do not know what type of incision you had.  You have had surgery on or into your uterus.  You have an active herpes outbreak.  You have cervical cancer.  The placenta blocks the opening of the cervix (placenta previa) or you have other condition that is blocking the cervix or vaginal outlet.  The baby is lying sideways.  Your pelvis is will not permit the passage of the baby.  You are carrying more than two babies. Summary  Augmentation of labor is when steps are taken to stimulate and strengthen contractions of the uterus during labor. This may be done when contractions have slowed down or stopped, delaying progress of labor and delivery of the baby.  Labor augmentation may be done using medicine to stimulate contractions (oxytocin) or by breaking the fluid-filled sac that surrounds the fetus (amniotic sac).  Labor should not be augmented if you have had a cesarean delivery and you had  a vertical incision or you do not know what type of incision you had. This information is not intended to replace advice given to you by your health care provider. Make sure you discuss any questions you have with your health care provider. Document Revised: 07/08/2019 Document Reviewed: 10/16/2016 Elsevier Patient Education  2020 Elsevier Inc.   

## 2020-06-09 NOTE — Progress Notes (Signed)
    Routine Prenatal Care Visit  Subjective  Catherine Dunn is a 19 y.o. G1P0 at [redacted]w[redacted]d being seen today for ongoing prenatal care.  She is currently monitored for the following issues for this low-risk pregnancy and has Supervision of normal first pregnancy, antepartum and Vaginal bleeding in pregnancy, first trimester on their problem list.  ----------------------------------------------------------------------------------- Patient reports no complaints.   Contractions: Not present. Vag. Bleeding: None.  Movement: Present. Denies leaking of fluid.  ----------------------------------------------------------------------------------- The following portions of the patient's history were reviewed and updated as appropriate: allergies, current medications, past family history, past medical history, past social history, past surgical history and problem list. Problem list updated.   Objective  Blood pressure 100/68, height 5\' 1"  (1.549 m), weight 149 lb 9.6 oz (67.9 kg), last menstrual period 09/18/2019. Pregravid weight 118 lb (53.5 kg) Total Weight Gain 31 lb 9.6 oz (14.3 kg) Urinalysis:      Fetal Status: Fetal Heart Rate (bpm): 160 Fundal Height: 37 cm Movement: Present     General:  Alert, oriented and cooperative. Patient is in no acute distress.  Skin: Skin is warm and dry. No rash noted.   Cardiovascular: Normal heart rate noted  Respiratory: Normal respiratory effort, no problems with respiration noted  Abdomen: Soft, gravid, appropriate for gestational age. Pain/Pressure: Absent     Pelvic:  Cervical exam deferred        Extremities: Normal range of motion.     Mental Status: Normal mood and affect. Normal behavior. Normal judgment and thought content.     Assessment   19 y.o. G1P0 at [redacted]w[redacted]d by  07/02/2020, by Ultrasound presenting for routine prenatal visit  Plan   pregnancy Problems (from 11/21/19 to present)    Problem Noted Resolved   Vaginal bleeding in pregnancy, first  trimester 12/04/2019 by 02/03/2020, MD No   Supervision of normal first pregnancy, antepartum 11/21/2019 by 11/23/2019, CNM No   Overview Addendum 05/06/2020  9:42 AM by 07/06/2020, CNM    [facility] Prenatal Labs  Dating By [redacted]w[redacted]d u/s Blood type: AB/Positive/-- (02/26 1145)   Genetic Screen 1 Screen:    AFP:     Quad:     NIPS: diploid XY Antibody:Negative (02/26 1145)  Anatomic 04-14-1985 Normal anatomy, anterior placenta Rubella: <0.90 (02/26 1145)  NONIMMUNE  Varicella: NONIMMUNE  GTT 28 wk: 127 RPR: Non Reactive (02/26 1145)   Rhogam  not needed HBsAg: Negative (02/26 1145)   Vaccines TDAP:                       Flu Shot: November 2020 HIV: Non Reactive (02/26 1145)   Baby Food Breast                               GBS:   Contraception  undecided Pap: less than 21 yo  CBB     CS/VBAC NA   Support Person Partner 04-14-1985          Previous Version      Reviewed contraception options  Gestational age appropriate obstetric precautions including but not limited to vaginal bleeding, contractions, leaking of fluid and fetal movement were reviewed in detail with the patient.    Return in about 1 week (around 06/16/2020) for ROB in person.  06/18/2020 MD [facility], [facility] 06/09/2020, 4:56 PM

## 2020-06-10 LAB — GC/CHLAMYDIA PROBE AMP
Chlamydia trachomatis, NAA: NEGATIVE
Neisseria Gonorrhoeae by PCR: NEGATIVE

## 2020-06-11 LAB — STREP GP B CULTURE+RFLX: Strep Gp B Culture+Rflx: NEGATIVE

## 2020-06-15 ENCOUNTER — Other Ambulatory Visit: Payer: Self-pay

## 2020-06-15 ENCOUNTER — Ambulatory Visit (INDEPENDENT_AMBULATORY_CARE_PROVIDER_SITE_OTHER): Payer: Medicaid Other | Admitting: Obstetrics and Gynecology

## 2020-06-15 VITALS — BP 104/66 | Wt 152.0 lb

## 2020-06-15 DIAGNOSIS — Z3400 Encounter for supervision of normal first pregnancy, unspecified trimester: Secondary | ICD-10-CM

## 2020-06-15 DIAGNOSIS — Z3A37 37 weeks gestation of pregnancy: Secondary | ICD-10-CM

## 2020-06-15 NOTE — Progress Notes (Signed)
    Routine Prenatal Care Visit  Subjective  Catherine Dunn is a 19 y.o. G1P0 at [redacted]w[redacted]d being seen today for ongoing prenatal care.  She is currently monitored for the following issues for this low-risk pregnancy and has Supervision of normal first pregnancy, antepartum and Vaginal bleeding in pregnancy, first trimester on their problem list.  ----------------------------------------------------------------------------------- Patient reports no complaints.   Contractions: Not present. Vag. Bleeding: None.  Movement: Present. Denies leaking of fluid.  ----------------------------------------------------------------------------------- The following portions of the patient's history were reviewed and updated as appropriate: allergies, current medications, past family history, past medical history, past social history, past surgical history and problem list. Problem list updated.   Objective  Blood pressure 104/66, weight 152 lb (68.9 kg), last menstrual period 09/18/2019. Pregravid weight 118 lb (53.5 kg) Total Weight Gain 34 lb (15.4 kg) Urinalysis:      Fetal Status: Fetal Heart Rate (bpm): 150 Fundal Height: 35 cm Movement: Present  Presentation: Vertex  General:  Alert, oriented and cooperative. Patient is in no acute distress.  Skin: Skin is warm and dry. No rash noted.   Cardiovascular: Normal heart rate noted  Respiratory: Normal respiratory effort, no problems with respiration noted  Abdomen: Soft, gravid, appropriate for gestational age. Pain/Pressure: Absent     Pelvic:  Cervical exam performed Dilation: Closed Effacement (%): 30 Station: -3  Extremities: Normal range of motion.     ental Status: Normal mood and affect. Normal behavior. Normal judgment and thought content.     Assessment   19 y.o. G1P0 at [redacted]w[redacted]d by  07/02/2020, by Ultrasound presenting for routine prenatal visit  Plan   pregnancy Problems (from 11/21/19 to present)    Problem Noted Resolved   Vaginal  bleeding in pregnancy, first trimester 12/04/2019 by Conard Novak, MD No   Supervision of normal first pregnancy, antepartum 11/21/2019 by Tresea Mall, CNM No   Overview Addendum 06/11/2020  2:05 PM by Mirna Mires, CNM    [facility] Prenatal Labs  Dating By [redacted]w[redacted]d u/s Blood type: AB/Positive/-- (02/26 1145)   Genetic Screen 1 Screen:    AFP:     Quad:     NIPS: diploid XY Antibody:Negative (02/26 1145)  Anatomic [place] Normal anatomy, anterior placenta Rubella: <0.90 (02/26 1145)  NONIMMUNE  Varicella: NONIMMUNE  GTT 28 wk: 127 RPR: Non Reactive (02/26 1145)   Rhogam Not needed HBsAg: Negative (02/26 1145)   Vaccines TDAP:   05/06/2020                  Flu Shot: November 2020 HIV: Non Reactive (02/26 1145)   Baby Food Breast                               GBS: negative  Contraception  undecided Pap: less than 21 yo  CBB     CS/VBAC NA   Support Person Partner Apolinar Junes          Previous Version       Gestational age appropriate obstetric precautions including but not limited to vaginal bleeding, contractions, leaking of fluid and fetal movement were reviewed in detail with the patient.    Return in about 1 week (around 06/22/2020) for ROB.  Vena Austria, MD, [facility], [facility] 06/15/2020, 4:24 PM

## 2020-06-15 NOTE — Progress Notes (Signed)
ROB

## 2020-06-22 ENCOUNTER — Ambulatory Visit (INDEPENDENT_AMBULATORY_CARE_PROVIDER_SITE_OTHER): Payer: Medicaid Other | Admitting: Advanced Practice Midwife

## 2020-06-22 ENCOUNTER — Encounter: Payer: Self-pay | Admitting: Advanced Practice Midwife

## 2020-06-22 ENCOUNTER — Other Ambulatory Visit: Payer: Self-pay

## 2020-06-22 VITALS — BP 122/74 | Wt 157.0 lb

## 2020-06-22 DIAGNOSIS — Z3A38 38 weeks gestation of pregnancy: Secondary | ICD-10-CM

## 2020-06-22 DIAGNOSIS — Z3400 Encounter for supervision of normal first pregnancy, unspecified trimester: Secondary | ICD-10-CM

## 2020-06-22 NOTE — Progress Notes (Signed)
No vb. No lof.  

## 2020-06-22 NOTE — Progress Notes (Signed)
  Routine Prenatal Care Visit  Subjective  Catherine Dunn is a 19 y.o. G1P0 at [redacted]w[redacted]d being seen today for ongoing prenatal care.  She is currently monitored for the following issues for this low-risk pregnancy and has Supervision of normal first pregnancy, antepartum and Vaginal bleeding in pregnancy, first trimester on their problem list.  ----------------------------------------------------------------------------------- Patient reports no complaints.   Contractions: Not present. Vag. Bleeding: None.  Movement: Present. Leaking Fluid denies.  ----------------------------------------------------------------------------------- The following portions of the patient's history were reviewed and updated as appropriate: allergies, current medications, past family history, past medical history, past social history, past surgical history and problem list. Problem list updated.  Objective  Blood pressure 122/74, weight 157 lb (71.2 kg), last menstrual period 09/18/2019. Pregravid weight 118 lb (53.5 kg) Total Weight Gain 39 lb (17.7 kg) Urinalysis: Urine Protein    Urine Glucose    Fetal Status: Fetal Heart Rate (bpm): 133 Fundal Height: 38 cm Movement: Present     General:  Alert, oriented and cooperative. Patient is in no acute distress.  Skin: Skin is warm and dry. No rash noted.   Cardiovascular: Normal heart rate noted  Respiratory: Normal respiratory effort, no problems with respiration noted  Abdomen: Soft, gravid, appropriate for gestational age. Pain/Pressure: Absent     Pelvic:  Cervical exam deferred        Extremities: Normal range of motion.  Edema: None  Mental Status: Normal mood and affect. Normal behavior. Normal judgment and thought content.   Assessment   19 y.o. G1P0 at [redacted]w[redacted]d by  07/02/2020, by Ultrasound presenting for routine prenatal visit  Plan   pregnancy Problems (from 11/21/19 to present)    Problem Noted Resolved   Vaginal bleeding in pregnancy, first trimester  12/04/2019 by Conard Novak, MD No   Supervision of normal first pregnancy, antepartum 11/21/2019 by Tresea Mall, CNM No   Overview Addendum 06/11/2020  2:05 PM by Mirna Mires, CNM    Clinic [facility] Prenatal Labs  Dating By [redacted]w[redacted]d u/s Blood type: AB/Positive/-- (02/26 1145)   Genetic Screen 1 Screen:    AFP:     Quad:     NIPS: diploid XY Antibody:Negative (02/26 1145)  Anatomic [place] Normal anatomy, anterior placenta Rubella: <0.90 (02/26 1145)  NONIMMUNE  Varicella: NONIMMUNE  GTT 28 wk: 127 RPR: Non Reactive (02/26 1145)   Rhogam Not needed HBsAg: Negative (02/26 1145)   Vaccines TDAP:   05/06/2020                  Flu Shot: November 2020 HIV: Non Reactive (02/26 1145)   Baby Food Breast                               GBS: negative  Contraception  undecided Pap: less than 21 yo  CBB     CS/VBAC NA   Support Person Partner Apolinar Junes          Previous Version       Term labor symptoms and general obstetric precautions including but not limited to vaginal bleeding, contractions, leaking of fluid and fetal movement were reviewed in detail with the patient.   Return in about 1 week (around 06/29/2020) for rob.  Tresea Mall, CNM 06/22/2020 4:42 PM

## 2020-06-29 ENCOUNTER — Other Ambulatory Visit: Payer: Self-pay

## 2020-06-29 ENCOUNTER — Inpatient Hospital Stay
Admission: EM | Admit: 2020-06-29 | Discharge: 2020-07-02 | DRG: 787 | Disposition: A | Discharge status: Home / Self Care | Payer: Medicaid Other | Attending: Obstetrics | Admitting: Obstetrics

## 2020-06-29 ENCOUNTER — Ambulatory Visit (INDEPENDENT_AMBULATORY_CARE_PROVIDER_SITE_OTHER): Payer: Medicaid Other | Admitting: Obstetrics

## 2020-06-29 ENCOUNTER — Encounter: Payer: Self-pay | Admitting: Obstetrics and Gynecology

## 2020-06-29 VITALS — BP 110/70 | Wt 157.0 lb

## 2020-06-29 DIAGNOSIS — Z3A39 39 weeks gestation of pregnancy: Secondary | ICD-10-CM

## 2020-06-29 DIAGNOSIS — O339 Maternal care for disproportion, unspecified: Secondary | ICD-10-CM | POA: Diagnosis present

## 2020-06-29 DIAGNOSIS — O26893 Other specified pregnancy related conditions, third trimester: Secondary | ICD-10-CM | POA: Diagnosis present

## 2020-06-29 DIAGNOSIS — Z3400 Encounter for supervision of normal first pregnancy, unspecified trimester: Secondary | ICD-10-CM

## 2020-06-29 DIAGNOSIS — Z20822 Contact with and (suspected) exposure to covid-19: Secondary | ICD-10-CM | POA: Diagnosis present

## 2020-06-29 DIAGNOSIS — Z87891 Personal history of nicotine dependence: Secondary | ICD-10-CM

## 2020-06-29 DIAGNOSIS — Z8616 Personal history of COVID-19: Secondary | ICD-10-CM | POA: Diagnosis not present

## 2020-06-29 DIAGNOSIS — O9081 Anemia of the puerperium: Secondary | ICD-10-CM | POA: Diagnosis not present

## 2020-06-29 DIAGNOSIS — D62 Acute posthemorrhagic anemia: Secondary | ICD-10-CM | POA: Diagnosis not present

## 2020-06-29 DIAGNOSIS — O4290 Premature rupture of membranes, unspecified as to length of time between rupture and onset of labor, unspecified weeks of gestation: Secondary | ICD-10-CM | POA: Diagnosis present

## 2020-06-29 DIAGNOSIS — O4202 Full-term premature rupture of membranes, onset of labor within 24 hours of rupture: Secondary | ICD-10-CM | POA: Diagnosis not present

## 2020-06-29 LAB — POCT URINALYSIS DIPSTICK OB
Glucose, UA: NEGATIVE
POC,PROTEIN,UA: NEGATIVE

## 2020-06-29 LAB — CBC
HCT: 34.5 % — ABNORMAL LOW (ref 36.0–46.0)
Hemoglobin: 11.4 g/dL — ABNORMAL LOW (ref 12.0–15.0)
MCH: 28.1 pg (ref 26.0–34.0)
MCHC: 33 g/dL (ref 30.0–36.0)
MCV: 85.2 fL (ref 80.0–100.0)
Platelets: 173 10*3/uL (ref 150–400)
RBC: 4.05 MIL/uL (ref 3.87–5.11)
RDW: 13.2 % (ref 11.5–15.5)
WBC: 8.9 10*3/uL (ref 4.0–10.5)
nRBC: 0 % (ref 0.0–0.2)

## 2020-06-29 MED ORDER — OXYTOCIN-SODIUM CHLORIDE 30-0.9 UT/500ML-% IV SOLN
1.0000 m[IU]/min | INTRAVENOUS | Status: DC
  Administered 2020-06-30: 2 m[IU]/min via INTRAVENOUS
  Filled 2020-06-29: qty 500

## 2020-06-29 MED ORDER — SOD CITRATE-CITRIC ACID 500-334 MG/5ML PO SOLN
30.0000 mL | ORAL | Status: DC | PRN
  Administered 2020-06-30: 30 mL via ORAL
  Filled 2020-06-29: qty 15

## 2020-06-29 MED ORDER — ONDANSETRON HCL 4 MG/2ML IJ SOLN: 4.0000 mg | Freq: Four times a day (QID) | INTRAMUSCULAR | Status: DC | PRN

## 2020-06-29 MED ORDER — LIDOCAINE HCL (PF) 1 % IJ SOLN: 30.0000 mL | INTRAMUSCULAR | Status: DC | PRN

## 2020-06-29 MED ORDER — LACTATED RINGERS IV SOLN: INTRAVENOUS | Status: DC

## 2020-06-29 MED ORDER — BUTORPHANOL TARTRATE 1 MG/ML IJ SOLN
2.0000 mg | INTRAMUSCULAR | Status: DC | PRN
  Administered 2020-06-29: 1 mg via INTRAVENOUS
  Filled 2020-06-29: qty 2

## 2020-06-29 MED ORDER — LACTATED RINGERS IV SOLN
500.0000 mL | INTRAVENOUS | Status: DC | PRN
  Administered 2020-06-30: 500 mL via INTRAVENOUS

## 2020-06-29 MED ORDER — TERBUTALINE SULFATE 1 MG/ML IJ SOLN: 0.2500 mg | Freq: Once | INTRAMUSCULAR | Status: DC | PRN

## 2020-06-29 MED ORDER — OXYTOCIN BOLUS FROM INFUSION: 333.0000 mL | Freq: Once | INTRAVENOUS | Status: DC

## 2020-06-29 MED ORDER — OXYTOCIN-SODIUM CHLORIDE 30-0.9 UT/500ML-% IV SOLN
2.5000 [IU]/h | INTRAVENOUS | Status: DC
  Filled 2020-06-29: qty 1000

## 2020-06-29 MED ORDER — ACETAMINOPHEN 325 MG PO TABS: 650.0000 mg | ORAL_TABLET | ORAL | Status: DC | PRN

## 2020-06-29 NOTE — OB Triage Note (Signed)
[facility] with complaints of contractions and LOF. Pt states that she noted a gush of brown/yellow fluids around 2030 and now just trinkling of fluids. Pt stated the contractions started around 1600. Pt states positive FM. Pt denies vaginal bleeding. Monitors applied and assesing. Initial FHT 150. Will continue to monitor.

## 2020-06-29 NOTE — Progress Notes (Signed)
Routine Prenatal Care Visit  Subjective  Catherine Dunn is a 19 y.o. G1P0 at [redacted]w[redacted]d being seen today for ongoing prenatal care.  She is currently monitored for the following issues for this low-risk pregnancy and has Supervision of normal first pregnancy, antepartum and Vaginal bleeding in pregnancy, first trimester on their problem list.  ----------------------------------------------------------------------------------- Patient reports no complaints.  She has been passing her mucous plug this week. Would like a cervical exam. At her last appt , there was discussion of an IOL. She is open to another visit later this week for a cervical sweep. She would like to avoid using a lot of pain medication, but is not opposed to an epidural.  .  .   . Leaking Fluid denies.  ----------------------------------------------------------------------------------- The following portions of the patient's history were reviewed and updated as appropriate: allergies, current medications, past family history, past medical history, past social history, past surgical history and problem list. Problem list updated.  Objective  Blood pressure 110/70, weight 157 lb (71.2 kg), last menstrual period 09/18/2019. Pregravid weight 118 lb (53.5 kg) Total Weight Gain 39 lb (17.7 kg) Urinalysis: Urine Protein Negative  Urine Glucose Negative  Fetal Status:           General:  Alert, oriented and cooperative. Patient is in no acute distress.  Skin: Skin is warm and dry. No rash noted.   Cardiovascular: Normal heart rate noted  Respiratory: Normal respiratory effort, no problems with respiration noted  Abdomen: Soft, gravid, appropriate for gestational age.       Pelvic:  Cervical exam performed      1.5 cms/80%/-2. Cervix is very posterior.  Extremities: Normal range of motion.     Mental Status: Normal mood and affect. Normal behavior. Normal judgment and thought content.   Assessment   19 y.o. G1P0 at [redacted]w[redacted]d by   07/02/2020, by Ultrasound presenting for routine prenatal visit  Plan   pregnancy Problems (from 11/21/19 to present)    Problem Noted Resolved   Vaginal bleeding in pregnancy, first trimester 12/04/2019 by Conard Novak, MD No   Supervision of normal first pregnancy, antepartum 11/21/2019 by Tresea Mall, CNM No   Overview Addendum 06/11/2020  2:05 PM by Mirna Mires, CNM    [facility]inic [facility] Prenatal Labs  Dating By [redacted]w[redacted]d u/s Blood type: AB/Positive/-- (02/26 1145)   Genetic Screen 1 Screen:    AFP:     Quad:     NIPS: diploid XY Antibody:Negative (02/26 1145)  Anatomic [place] Normal anatomy, anterior placenta Rubella: <0.90 (02/26 1145)  NONIMMUNE  Varicella: NONIMMUNE  GTT 28 wk: 127 RPR: Non Reactive (02/26 1145)   Rhogam Not needed HBsAg: Negative (02/26 1145)   Vaccines TDAP:   05/06/2020                  Flu Shot: November 2020 HIV: Non Reactive (02/26 1145)   Baby Food Breast                               GBS: negative  Contraception  undecided Pap: less than 21 yo  CBB     CS/VBAC NA   Support Person Partner Apolinar Junes          Previous Version       Term labor symptoms and general obstetric precautions including but not limited to vaginal bleeding, contractions, leaking of fluid and fetal movement were reviewed in detail with the patient. Please  refer to After Visit Summary for other counseling recommendations.  We discussed some natural cervical ripeners: IC, orgasm,  And taking some walks to best position the baby for labor. She wants to return in a few days for another ROB and cervicl sweep- if she has not delivered by the weekend, would like to schedule an IOL.  Return in about 3 days (around 07/02/2020) for return OB and cervical check.Mirna Mires, CNM  06/29/2020 4:33 PM

## 2020-06-29 NOTE — Progress Notes (Signed)
C/o [email or web address] [email or web address] 

## 2020-06-29 NOTE — H&P (Signed)
Catherine Dunn is an 19 y.o. female.   Chief Complaint: Leakage of fluid.   HPI: She presents today with complaints of leakage of yellow brown fluid since 8:30 PM. She has been having worsening contractions since that time as well. She is breathing heavily through the contractions. She denies vaginal bleeding. She has normal fetal movement.  Her pregnancy has been uncomplicated.   pregnancy Problems (from 11/21/19 to present)    Problem Noted Resolved   Vaginal bleeding in pregnancy, first trimester 12/04/2019 by Conard Novak, MD No   Supervision of normal first pregnancy, antepartum 11/21/2019 by Tresea Mall, CNM No   Overview Addendum 06/11/2020  2:05 PM by Mirna Mires, CNM    [facility]inic [facility] Prenatal Labs  Dating By [redacted]w[redacted]d u/s Blood type: AB/Positive/-- (02/26 1145)   Genetic Screen NIPS: diploid XY Antibody:Negative (02/26 1145)  Anatomic [place] Normal anatomy, anterior placenta Rubella: <0.90 (02/26 1145)  NONIMMUNE  Varicella: NONIMMUNE  GTT 28 wk: 127 RPR: Non Reactive (02/26 1145)   Rhogam Not needed HBsAg: Negative (02/26 1145)   Vaccines TDAP:   05/06/2020                  Flu Shot: November 2020 HIV: Non Reactive (02/26 1145)   Baby Food Breast                               GBS: negative  Contraception  undecided Pap: less than 21 yo  CBB     CS/VBAC NA   Support Person Partner Brandon          Previous Version       Past Medical History:  Diagnosis Date  . COVID-19 virus infection   . Gastroenteritis     Past Surgical History:  Procedure Laterality Date  . WISDOM TOOTH EXTRACTION      Family History  Problem Relation Age of Onset  . COPD Mother   . Heart attack Mother   . Diverticulitis Mother   . Hypertension Mother   . Ovarian cancer Mother    Social History:  reports that she has quit smoking. Her smoking use included e-cigarettes. She has never used smokeless tobacco. She reports previous alcohol use. She reports that she does not use  drugs.  Allergies: No Known Allergies  Medications Prior to Admission  Medication Sig Dispense Refill  . Prenatal Vit-Fe Fumarate-FA (PRENATAL VITAMINS) 28-0.8 MG TABS Take 1 tablet by mouth daily.      Results for orders placed or performed in visit on 06/29/20 (from the past 48 hour(s))  POC Urinalysis Dipstick OB     Status: Normal   Collection Time: 06/29/20  4:16 PM  Result Value Ref Range   Color, UA     Clarity, UA     Glucose, UA Negative Negative   Bilirubin, UA     Ketones, UA     Spec Grav, UA     Blood, UA     pH, UA     POC,PROTEIN,UA Negative Negative, Trace, Small (1+), Moderate (2+), Large (3+), 4+   Urobilinogen, UA     Nitrite, UA     Leukocytes, UA     Appearance     Odor     No results found.  Review of Systems  Constitutional: Negative for chills and fever.  HENT: Negative for congestion, hearing loss and sinus pain.   Respiratory: Negative for cough, shortness of breath and  wheezing.   Cardiovascular: Negative for chest pain, palpitations and leg swelling.  Gastrointestinal: Negative for abdominal pain, constipation, diarrhea, nausea and vomiting.  Genitourinary: Negative for dysuria, flank pain, frequency, hematuria and urgency.  Musculoskeletal: Negative for back pain.  Skin: Negative for rash.  Neurological: Negative for dizziness and headaches.  Psychiatric/Behavioral: Negative for suicidal ideas. The patient is not nervous/anxious.     Blood pressure (!) 146/89, pulse (!) 103, temperature 98.4 F (36.9 C), temperature source Oral, resp. rate 18, height 5\' 1"  (1.549 m), weight 71.2 kg, last menstrual period 09/18/2019. Physical Exam Vitals and nursing note reviewed.  Constitutional:      Appearance: She is well-developed.  HENT:     Head: Normocephalic and atraumatic.  Cardiovascular:     Rate and Rhythm: Normal rate and regular rhythm.  Pulmonary:     Effort: Pulmonary effort is normal.     Breath sounds: Normal breath sounds.   Abdominal:     General: Bowel sounds are normal.     Palpations: Abdomen is soft.  Musculoskeletal:        General: Normal range of motion.  Skin:    General: Skin is warm and dry.  Neurological:     Mental Status: She is alert and oriented to person, place, and time.  Psychiatric:        Behavior: Behavior normal.        Thought Content: Thought content normal.        Judgment: Judgment normal.     NST: 150 bpm baseline, moderate variability, 15x15 accelerations, no decelerations. Tocometer : every 4-5 minutes  SVE: per nursing 1.5 cm, unchanged from yesterday  Assessment/Plan 19 yo with leakage of fluid 1. ROM with meconium. Will admit for latent labor. Will augment with pitocin if necessary.  2. GBS negative 3. Stadol and/or epidural as needed for pain management.  4. Clear liquid diet.    36, MD 06/29/2020, 10:17 PM

## 2020-06-30 ENCOUNTER — Encounter: Payer: Self-pay | Admitting: Obstetrics and Gynecology

## 2020-06-30 ENCOUNTER — Encounter
Admission: EM | Disposition: A | Discharge status: Home / Self Care | Payer: Self-pay | Source: Home / Self Care | Attending: Obstetrics

## 2020-06-30 ENCOUNTER — Inpatient Hospital Stay: Payer: Medicaid Other | Admitting: Anesthesiology

## 2020-06-30 DIAGNOSIS — O4202 Full-term premature rupture of membranes, onset of labor within 24 hours of rupture: Secondary | ICD-10-CM

## 2020-06-30 LAB — COMPREHENSIVE METABOLIC PANEL
ALT: 9 U/L (ref 0–44)
AST: 16 U/L (ref 15–41)
Albumin: 3 g/dL — ABNORMAL LOW (ref 3.5–5.0)
Alkaline Phosphatase: 143 U/L — ABNORMAL HIGH (ref 38–126)
Anion gap: 11 (ref 5–15)
BUN: <5 mg/dL — ABNORMAL LOW (ref 6–20)
CO2: 24 mmol/L (ref 22–32)
Calcium: 8.8 mg/dL — ABNORMAL LOW (ref 8.9–10.3)
Chloride: 103 mmol/L (ref 98–111)
Creatinine, Ser: 0.56 mg/dL (ref 0.44–1.00)
GFR calc non Af Amer: >60 mL/min (ref >60)
Glucose, Bld: 86 mg/dL (ref 70–99)
Potassium: 3.9 mmol/L (ref 3.5–5.1)
Sodium: 138 mmol/L (ref 135–145)
Total Bilirubin: 0.5 mg/dL (ref 0.3–1.2)
Total Protein: 6.4 g/dL — ABNORMAL LOW (ref 6.5–8.1)

## 2020-06-30 LAB — RAPID HIV SCREEN (HIV 1/2 AB+AG)
HIV 1/2 Antibodies: NONREACTIVE
HIV-1 P24 Antigen - HIV24: NONREACTIVE

## 2020-06-30 LAB — PROTEIN / CREATININE RATIO, URINE
Creatinine, Urine: <10 mg/dL
Total Protein, Urine: 115 mg/dL

## 2020-06-30 LAB — RESPIRATORY PANEL BY RT PCR (FLU A&B, COVID)
Influenza A by PCR: NEGATIVE
Influenza B by PCR: NEGATIVE
SARS Coronavirus 2 by RT PCR: NEGATIVE

## 2020-06-30 LAB — RUPTURE OF MEMBRANE (ROM)PLUS: Rom Plus: NEGATIVE

## 2020-06-30 LAB — ABO/RH: ABO/RH(D): AB POS

## 2020-06-30 LAB — RPR: RPR Ser Ql: NONREACTIVE

## 2020-06-30 SURGERY — Surgical Case
Anesthesia: Epidural

## 2020-06-30 MED ORDER — METHYLERGONOVINE MALEATE 0.2 MG/ML IJ SOLN
INTRAMUSCULAR | Status: AC
  Filled 2020-06-30: qty 1

## 2020-06-30 MED ORDER — WITCH HAZEL-GLYCERIN EX PADS: 1.0000 "application " | MEDICATED_PAD | CUTANEOUS | Status: DC | PRN

## 2020-06-30 MED ORDER — SODIUM CHLORIDE 0.9 % IV SOLN
INTRAVENOUS | Status: DC | PRN
  Administered 2020-06-30: 50 ug/min via INTRAVENOUS

## 2020-06-30 MED ORDER — ONDANSETRON HCL 4 MG/2ML IJ SOLN: 4.0000 mg | Freq: Three times a day (TID) | INTRAMUSCULAR | Status: DC | PRN

## 2020-06-30 MED ORDER — OXYTOCIN-SODIUM CHLORIDE 30-0.9 UT/500ML-% IV SOLN
2.5000 [IU]/h | INTRAVENOUS | Status: AC
  Administered 2020-06-30 – 2020-07-01 (×2): 2.5 [IU]/h via INTRAVENOUS
  Filled 2020-06-30: qty 500

## 2020-06-30 MED ORDER — MORPHINE SULFATE (PF) 0.5 MG/ML IJ SOLN
INTRAMUSCULAR | Status: DC | PRN
Start: 2020-06-30 — End: 2020-06-30
  Administered 2020-06-30: .1 mg via INTRATHECAL

## 2020-06-30 MED ORDER — MEPERIDINE HCL 25 MG/ML IJ SOLN: 6.2500 mg | INTRAMUSCULAR | Status: DC | PRN

## 2020-06-30 MED ORDER — EPHEDRINE 5 MG/ML INJ: 10.0000 mg | INTRAVENOUS | Status: DC | PRN

## 2020-06-30 MED ORDER — KETOROLAC TROMETHAMINE 30 MG/ML IJ SOLN: 30.0000 mg | Freq: Four times a day (QID) | INTRAMUSCULAR | Status: AC | PRN

## 2020-06-30 MED ORDER — COCONUT OIL OIL
1.0000 "application " | TOPICAL_OIL | Status: DC | PRN
  Administered 2020-07-02: 1 via TOPICAL
  Filled 2020-06-30: qty 120

## 2020-06-30 MED ORDER — FENTANYL CITRATE (PF) 100 MCG/2ML IJ SOLN
INTRAMUSCULAR | Status: DC | PRN
Start: 2020-06-30 — End: 2020-06-30
  Administered 2020-06-30: 10 ug via INTRATHECAL

## 2020-06-30 MED ORDER — KETOROLAC TROMETHAMINE 30 MG/ML IJ SOLN
INTRAMUSCULAR | Status: AC
  Filled 2020-06-30: qty 1

## 2020-06-30 MED ORDER — IBUPROFEN 800 MG PO TABS
800.0000 mg | ORAL_TABLET | Freq: Four times a day (QID) | ORAL | Status: DC
  Administered 2020-07-01 – 2020-07-02 (×3): 800 mg via ORAL
  Filled 2020-06-30 (×3): qty 1

## 2020-06-30 MED ORDER — SENNOSIDES-DOCUSATE SODIUM 8.6-50 MG PO TABS
2.0000 | ORAL_TABLET | ORAL | Status: DC
  Administered 2020-07-01 – 2020-07-02 (×2): 2 via ORAL
  Filled 2020-06-30 (×2): qty 2

## 2020-06-30 MED ORDER — KETOROLAC TROMETHAMINE 30 MG/ML IJ SOLN
30.0000 mg | Freq: Four times a day (QID) | INTRAMUSCULAR | Status: AC
  Administered 2020-06-30 – 2020-07-01 (×4): 30 mg via INTRAVENOUS
  Filled 2020-06-30 (×4): qty 1

## 2020-06-30 MED ORDER — LIDOCAINE-EPINEPHRINE (PF) 1.5 %-1:200000 IJ SOLN
INTRAMUSCULAR | Status: DC | PRN
  Administered 2020-06-30: 3 mL via EPIDURAL

## 2020-06-30 MED ORDER — SIMETHICONE 80 MG PO CHEW
80.0000 mg | CHEWABLE_TABLET | ORAL | Status: DC
  Administered 2020-07-01: 80 mg via ORAL
  Filled 2020-06-30: qty 1

## 2020-06-30 MED ORDER — FENTANYL 2.5 MCG/ML W/ROPIVACAINE 0.15% IN NS 100 ML EPIDURAL (ARMC)
EPIDURAL | Status: AC
  Filled 2020-06-30: qty 100

## 2020-06-30 MED ORDER — FENTANYL 2.5 MCG/ML W/ROPIVACAINE 0.15% IN NS 100 ML EPIDURAL (ARMC)
EPIDURAL | Status: DC | PRN
Start: 2020-06-30 — End: 2020-06-30
  Administered 2020-06-30: 12 mL/h via EPIDURAL

## 2020-06-30 MED ORDER — BUPIVACAINE IN DEXTROSE 0.75-8.25 % IT SOLN
INTRATHECAL | Status: DC | PRN
  Administered 2020-06-30: 1.3 mL via INTRATHECAL

## 2020-06-30 MED ORDER — SODIUM CHLORIDE 0.9 % IV SOLN
INTRAVENOUS | Status: DC | PRN
  Administered 2020-06-30: 9 mL via EPIDURAL

## 2020-06-30 MED ORDER — PHENYLEPHRINE 40 MCG/ML (10ML) SYRINGE FOR IV PUSH (FOR BLOOD PRESSURE SUPPORT): 80.0000 ug | PREFILLED_SYRINGE | INTRAVENOUS | Status: DC | PRN

## 2020-06-30 MED ORDER — LACTATED RINGERS IV SOLN
INTRAVENOUS | Status: DC
  Administered 2020-06-30: 250 mL via INTRAUTERINE

## 2020-06-30 MED ORDER — LIDOCAINE HCL (PF) 1 % IJ SOLN
INTRAMUSCULAR | Status: DC | PRN
  Administered 2020-06-30: 3 mL

## 2020-06-30 MED ORDER — BUPIVACAINE HCL 0.5 % IJ SOLN: 10.0000 mL | Freq: Once | INTRAMUSCULAR | Status: DC

## 2020-06-30 MED ORDER — CARBOPROST TROMETHAMINE 250 MCG/ML IM SOLN
INTRAMUSCULAR | Status: AC
  Filled 2020-06-30: qty 1

## 2020-06-30 MED ORDER — BUPIVACAINE HCL (PF) 0.5 % IJ SOLN
INTRAMUSCULAR | Status: DC | PRN
  Administered 2020-06-30: 10 mL

## 2020-06-30 MED ORDER — BUPIVACAINE 0.25 % ON-Q PUMP DUAL CATH 400 ML
400.0000 mL | INJECTION | Status: DC
  Filled 2020-06-30: qty 400

## 2020-06-30 MED ORDER — MEASLES, MUMPS & RUBELLA VAC IJ SOLR
0.5000 mL | Freq: Once | INTRAMUSCULAR | Status: DC
  Filled 2020-06-30: qty 0.5

## 2020-06-30 MED ORDER — NALBUPHINE HCL 10 MG/ML IJ SOLN: 5.0000 mg | INTRAMUSCULAR | Status: DC | PRN

## 2020-06-30 MED ORDER — MORPHINE SULFATE (PF) 0.5 MG/ML IJ SOLN
INTRAMUSCULAR | Status: AC
Start: 2020-06-30 — End: ?
  Filled 2020-06-30: qty 10

## 2020-06-30 MED ORDER — OXYCODONE-ACETAMINOPHEN 5-325 MG PO TABS
1.0000 | ORAL_TABLET | ORAL | Status: DC | PRN
  Administered 2020-07-01: 2 via ORAL
  Filled 2020-06-30: qty 2

## 2020-06-30 MED ORDER — FENTANYL 2.5 MCG/ML W/ROPIVACAINE 0.15% IN NS 100 ML EPIDURAL (ARMC)
12.0000 mL/h | EPIDURAL | Status: DC
  Administered 2020-06-30: 12 mL/h via EPIDURAL
  Filled 2020-06-30: qty 100

## 2020-06-30 MED ORDER — OXYTOCIN-SODIUM CHLORIDE 30-0.9 UT/500ML-% IV SOLN
INTRAVENOUS | Status: DC | PRN
  Administered 2020-06-30: 250 mL via INTRAVENOUS

## 2020-06-30 MED ORDER — DIPHENHYDRAMINE HCL 50 MG/ML IJ SOLN: 12.5000 mg | INTRAMUSCULAR | Status: DC | PRN

## 2020-06-30 MED ORDER — SODIUM CHLORIDE 0.9 % IV SOLN: 2.0000 g | Freq: Once | INTRAVENOUS | Status: DC

## 2020-06-30 MED ORDER — SIMETHICONE 80 MG PO CHEW
80.0000 mg | CHEWABLE_TABLET | Freq: Three times a day (TID) | ORAL | Status: DC
  Administered 2020-07-01 – 2020-07-02 (×5): 80 mg via ORAL
  Filled 2020-06-30 (×5): qty 1

## 2020-06-30 MED ORDER — PRENATAL MULTIVITAMIN CH
1.0000 | ORAL_TABLET | Freq: Every day | ORAL | Status: DC
  Administered 2020-07-01 – 2020-07-02 (×2): 1 via ORAL
  Filled 2020-06-30 (×2): qty 1

## 2020-06-30 MED ORDER — FENTANYL CITRATE (PF) 100 MCG/2ML IJ SOLN
INTRAMUSCULAR | Status: AC
  Filled 2020-06-30: qty 2

## 2020-06-30 MED ORDER — VARICELLA VIRUS VACCINE LIVE 1350 PFU/0.5ML IJ SUSR
0.5000 mL | Freq: Once | INTRAMUSCULAR | Status: DC
  Filled 2020-06-30: qty 0.5

## 2020-06-30 MED ORDER — ONDANSETRON HCL 4 MG/2ML IJ SOLN
INTRAMUSCULAR | Status: DC | PRN
  Administered 2020-06-30: 4 mg via INTRAVENOUS

## 2020-06-30 MED ORDER — ONDANSETRON HCL 4 MG/2ML IJ SOLN
INTRAMUSCULAR | Status: AC
  Filled 2020-06-30: qty 2

## 2020-06-30 MED ORDER — DIPHENHYDRAMINE HCL 25 MG PO CAPS: 25.0000 mg | ORAL_CAPSULE | Freq: Four times a day (QID) | ORAL | Status: DC | PRN

## 2020-06-30 MED ORDER — AMMONIA AROMATIC IN INHA
RESPIRATORY_TRACT | Status: AC
  Filled 2020-06-30: qty 10

## 2020-06-30 MED ORDER — LACTATED RINGERS IV SOLN
500.0000 mL | Freq: Once | INTRAVENOUS | Status: AC
  Administered 2020-06-30: 500 mL via INTRAVENOUS

## 2020-06-30 MED ORDER — NALBUPHINE HCL 10 MG/ML IJ SOLN: 5.0000 mg | Freq: Once | INTRAMUSCULAR | Status: DC | PRN

## 2020-06-30 MED ORDER — SIMETHICONE 80 MG PO CHEW: 80.0000 mg | CHEWABLE_TABLET | ORAL | Status: DC | PRN

## 2020-06-30 MED ORDER — DIBUCAINE (PERIANAL) 1 % EX OINT: 1.0000 "application " | TOPICAL_OINTMENT | CUTANEOUS | Status: DC | PRN

## 2020-06-30 MED ORDER — LIDOCAINE HCL (PF) 1 % IJ SOLN
INTRAMUSCULAR | Status: AC
  Filled 2020-06-30: qty 30

## 2020-06-30 MED ORDER — PROMETHAZINE HCL 25 MG/ML IJ SOLN: 6.2500 mg | INTRAMUSCULAR | Status: DC | PRN

## 2020-06-30 MED ORDER — MENTHOL 3 MG MT LOZG
1.0000 | LOZENGE | OROMUCOSAL | Status: DC | PRN
  Filled 2020-06-30: qty 9

## 2020-06-30 MED ORDER — ZOLPIDEM TARTRATE 5 MG PO TABS: 5.0000 mg | ORAL_TABLET | Freq: Every evening | ORAL | Status: DC | PRN

## 2020-06-30 MED ORDER — MISOPROSTOL 200 MCG PO TABS
ORAL_TABLET | ORAL | Status: AC
  Filled 2020-06-30: qty 4

## 2020-06-30 MED ORDER — SODIUM CHLORIDE 0.9% FLUSH: 3.0000 mL | INTRAVENOUS | Status: DC | PRN

## 2020-06-30 MED ORDER — BUPIVACAINE HCL (PF) 0.25 % IJ SOLN
INTRAMUSCULAR | Status: DC | PRN
  Administered 2020-06-30: 5 mL via EPIDURAL

## 2020-06-30 MED ORDER — OXYTOCIN 10 UNIT/ML IJ SOLN
INTRAMUSCULAR | Status: AC
  Filled 2020-06-30: qty 2

## 2020-06-30 MED ORDER — LACTATED RINGERS IV SOLN: INTRAVENOUS | Status: DC

## 2020-06-30 MED ORDER — SODIUM CHLORIDE 0.9 % IV SOLN
INTRAVENOUS | Status: AC
  Filled 2020-06-30: qty 2

## 2020-06-30 MED ORDER — KETOROLAC TROMETHAMINE 30 MG/ML IJ SOLN
INTRAMUSCULAR | Status: DC | PRN
  Administered 2020-06-30: 30 mg via INTRAVENOUS

## 2020-06-30 MED ORDER — FENTANYL CITRATE (PF) 100 MCG/2ML IJ SOLN: 25.0000 ug | INTRAMUSCULAR | Status: DC | PRN

## 2020-06-30 MED ORDER — NALOXONE HCL 0.4 MG/ML IJ SOLN: 0.4000 mg | INTRAMUSCULAR | Status: DC | PRN

## 2020-06-30 MED ORDER — DIPHENHYDRAMINE HCL 25 MG PO CAPS: 25.0000 mg | ORAL_CAPSULE | ORAL | Status: DC | PRN

## 2020-06-30 MED ORDER — ACETAMINOPHEN 325 MG PO TABS
650.0000 mg | ORAL_TABLET | ORAL | Status: DC | PRN
  Administered 2020-07-01: 650 mg via ORAL
  Filled 2020-06-30: qty 2

## 2020-06-30 MED ORDER — FENTANYL CITRATE (PF) 100 MCG/2ML IJ SOLN
INTRAMUSCULAR | Status: DC | PRN
Start: 2020-06-30 — End: 2020-06-30
  Administered 2020-06-30: 100 ug via INTRAVENOUS

## 2020-06-30 MED ORDER — NALOXONE HCL 4 MG/10ML IJ SOLN
1.0000 ug/kg/h | INTRAVENOUS | Status: DC | PRN
  Filled 2020-06-30: qty 5

## 2020-06-30 MED ORDER — BUPIVACAINE ON-Q PAIN PUMP (FOR ORDER SET NO CHG)
INJECTION | Status: DC
  Filled 2020-06-30: qty 1

## 2020-06-30 MED ORDER — BUPIVACAINE HCL (PF) 0.5 % IJ SOLN
INTRAMUSCULAR | Status: AC
  Filled 2020-06-30: qty 30

## 2020-06-30 MED ORDER — MORPHINE SULFATE (PF) 2 MG/ML IV SOLN: 1.0000 mg | INTRAVENOUS | Status: DC | PRN

## 2020-06-30 MED ORDER — OXYCODONE HCL 5 MG PO TABS
5.0000 mg | ORAL_TABLET | ORAL | Status: DC | PRN
  Administered 2020-07-01 – 2020-07-02 (×3): 5 mg via ORAL
  Filled 2020-06-30 (×3): qty 1

## 2020-06-30 SURGICAL SUPPLY — 25 items
CANISTER SUCT 3000ML PPV (MISCELLANEOUS) ×2 IMPLANT
CATH KIT ON-Q SILVERSOAK 5IN (CATHETERS) ×4 IMPLANT
CHLORAPREP W/TINT 26 (MISCELLANEOUS) ×4 IMPLANT
COVER WAND RF STERILE (DRAPES) ×2 IMPLANT
DERMABOND ADVANCED (GAUZE/BANDAGES/DRESSINGS) ×1
DERMABOND ADVANCED .7 DNX12 (GAUZE/BANDAGES/DRESSINGS) ×1 IMPLANT
DRSG OPSITE POSTOP 4X10 (GAUZE/BANDAGES/DRESSINGS) ×2 IMPLANT
ELECT CAUTERY BLADE 6.4 (BLADE) ×2 IMPLANT
ELECT REM PT RETURN 9FT ADLT (ELECTROSURGICAL) ×2
ELECTRODE REM PT RTRN 9FT ADLT (ELECTROSURGICAL) ×1 IMPLANT
GLOVE SKINSENSE NS SZ8.0 LF (GLOVE) ×1
GLOVE SKINSENSE STRL SZ8.0 LF (GLOVE) ×1 IMPLANT
GOWN STRL REUS W/ TWL LRG LVL3 (GOWN DISPOSABLE) ×1 IMPLANT
GOWN STRL REUS W/ TWL XL LVL3 (GOWN DISPOSABLE) ×2 IMPLANT
GOWN STRL REUS W/TWL LRG LVL3 (GOWN DISPOSABLE) ×1
GOWN STRL REUS W/TWL XL LVL3 (GOWN DISPOSABLE) ×2
NS IRRIG 1000ML POUR BTL (IV SOLUTION) ×2 IMPLANT
PACK C SECTION AR (MISCELLANEOUS) ×2 IMPLANT
PAD OB MATERNITY 4.3X12.25 (PERSONAL CARE ITEMS) ×2 IMPLANT
PAD PREP 24X41 OB/GYN DISP (PERSONAL CARE ITEMS) ×2 IMPLANT
PENCIL SMOKE ULTRAEVAC 22 CON (MISCELLANEOUS) ×2 IMPLANT
SUT MAXON ABS #0 GS21 30IN (SUTURE) ×4 IMPLANT
SUT VIC AB 1 CT1 36 (SUTURE) ×8 IMPLANT
SUT VIC AB 2-0 CT1 36 (SUTURE) ×2 IMPLANT
SUT VIC AB 4-0 FS2 27 (SUTURE) ×2 IMPLANT

## 2020-06-30 NOTE — Discharge Summary (Signed)
Postpartum Discharge Summary  Date of Service updated: 07/02/2020     Patient Name: Catherine Dunn DOB: 2001-04-02 MRN: [id number]  Date of admission: 06/29/2020 Delivery date:06/30/2020  Delivering provider: Gae Dry  Date of discharge: 07/02/2020  Admitting diagnosis: ROM (rupture of membranes), premature [O42.90] Intrauterine pregnancy: [redacted]w[redacted]d    Secondary diagnosis:  Active Problems:   ROM (rupture of membranes), premature   Failure to progress in labor   Postpartum care following cesarean delivery   Single liveborn infant, delivered by cesarean  Additional problems: none    Discharge diagnosis: Term Pregnancy Delivered                                              Post partum procedures: none Augmentation: Pitocin Complications: None  Hospital course: Onset of Labor With Unplanned C/S   19y.o. yo G1P1001 at 377w5das admitted in Latent Labor on 06/29/2020. Patient had a labor course significant for arrest of dilation at 7 cm. The patient went for cesarean section due to Arrest of Dilation. Delivery details as follows: Membrane Rupture Time/Date: 8:30 PM ,06/29/2020   Delivery Method:C-Section, Low Transverse  Details of operation can be found in separate operative note. Patient had an uncomplicated postpartum course.  She is tolerating regular diet. Her pain is controlled with PO medication. She is ambulating and voiding without difficulty. She reports some difficulty with sustaining latch and will see LC before discharge.  Patient is discharged home in stable condition 07/02/20.  Newborn Data: Birth date:06/30/2020  Birth time:4:31 PM  Gender:Female  Living status:Living  Apgars:9 ,9  Weight:3430 g   Magnesium Sulfate received: No BMZ received: No Rhophylac:No MMR:Yes T-DaP:Given prenatally Flu: No Transfusion:No  Physical exam  Vitals:   07/01/20 1500 07/01/20 1603 07/02/20 0038 07/02/20 0723  BP:  110/63 122/77 134/88  Pulse: 81 82 85 89  Resp:  _0 Temp:  98.4 F (36.9 C) 97.9 F (36.6 C) 98 F (36.7 C)  TempSrc:  Oral Oral Oral  SpO2: 93%  98% 98%  Weight:      Height:       General: alert, cooperative and no distress Lochia: appropriate Uterine Fundus: firm Incision: Healing well with no significant drainage, Dressing is clean, dry, and intact, On Q pump discontinued prior to discharge DVT Evaluation: No evidence of DVT seen on physical exam. Labs: Lab Results  Component Value Date   WBC 16.4 (H) 07/01/2020   HGB 9.7 (L) 07/01/2020   HCT 30.7 (L) 07/01/2020   MCV 88.0 07/01/2020   PLT 152 07/01/2020   CMP Latest Ref Rng & Units 06/30/2020  Glucose 70 - 99 mg/dL 86  BUN 6 - 20 mg/dL <5(L)  Creatinine 0.44 - 1.00 mg/dL 0.56  Sodium 135 - 145 mmol/L 138  Potassium 3.5 - 5.1 mmol/L 3.9  Chloride 98 - 111 mmol/L 103  CO2 22 - 32 mmol/L 24  Calcium 8.9 - 10.3 mg/dL 8.8(L)  Total Protein 6.5 - 8.1 g/dL 6.4(L)  Total Bilirubin 0.3 - 1.2 mg/dL 0.5  Alkaline Phos 38 - 126 U/L 143(H)  AST 15 - 41 U/L 16  ALT 0 - 44 U/L 9   Edinburgh Score: Edinburgh Postnatal Depression Scale Screening Tool 07/01/2020  I have been able to laugh and see the funny side of things. 0  I have looked  forward with enjoyment to things. 0  I have blamed myself unnecessarily when things went wrong. 2  I have been anxious or worried for no good reason. 2  I have felt scared or panicky for no good reason. 2  Things have been getting on top of me. 0  I have been so unhappy that I have had difficulty sleeping. 0  I have felt sad or miserable. 1  I have been so unhappy that I have been crying. 0  The thought of harming myself has occurred to me. 0  Edinburgh Postnatal Depression Scale Total 7      After visit meds:  Allergies as of 07/02/2020   No Known Allergies     Medication List    TAKE these medications   oxyCODONE 5 MG immediate release tablet Commonly known as: Oxy IR/ROXICODONE Take 1 tablet (5 mg total) by mouth every 6 (six)  hours as needed for up to 5 days for moderate pain or severe pain.   Prenatal Vitamins 28-0.8 MG Tabs Take 1 tablet by mouth daily.            Discharge Care Instructions  (From admission, onward)         Start     Ordered   07/02/20 0000  Discharge wound care:       Comments: Keep incision dry, clean.   07/02/20 1103           Discharge home in stable condition Infant Feeding: Breast and may switch to formula Infant Disposition:home with mother Discharge instruction: per After Visit Summary and Postpartum booklet. Activity: Advance as tolerated. Pelvic rest for 6 weeks.  Diet: routine diet Anticipated Birth Control: Condoms, she is considering Nexplanon Postpartum Appointment:1 week Additional Postpartum F/U: Incision check 1 week Future Appointments: 07/07/2020 at 2 PM [facility]/Dr Kenton Kingfisher  Follow up Visit:  Follow-up Information    Gae Dry, MD. Go to a visit in 1 week for incision check   Specialty: Obstetrics and Gynecology Why: Post op check Contact information: [address] [place] [address] [phone]                07/02/2020 Rod Can, CNM

## 2020-06-30 NOTE — Op Note (Signed)
Cesarean Section Procedure Note Indications: cephalo-pelvic disproportion, failure to progress: arrest of dilation and term intrauterine pregnancy  Pre-operative Diagnosis:    cephalo-pelvic disproportion, failure to progress: arrest of dilation and term intrauterine pregnancy,  [redacted] weeks EGA Post-operative Diagnosis: same, delivered. Procedure: Low Transverse Cesarean Section Surgeon: Annamarie Major, MD, FACOG Assistant(s): Liana Crocker, CNM, No other capable assistant available, in surgery requiring high level assistant. Anesthesia: Spinal anesthesia Estimated Blood Loss:435 mL Complications: None; patient tolerated the procedure well. Disposition: PACU - hemodynamically stable. Condition: stable  Findings: A female infant in the cephalic presentation. Amniotic fluid - Clear  Birth weight 7-9 lbs.  Apgars of 9 and 9.  Intact placenta with a three-vessel cord. Grossly normal uterus, tubes and ovaries bilaterally. No intraabdominal adhesions were noted.  Procedure Details   The patient was taken to Operating Room, identified as the correct patient and the procedure verified as C-Section Delivery. A Time Out was held and the above information confirmed. After induction of anesthesia, the patient was draped and prepped in the usual sterile manner. A Pfannenstiel incision was made and carried down through the subcutaneous tissue to the fascia. Fascial incision was made and extended transversely with the Mayo scissors. The fascia was separated from the underlying rectus tissue superiorly and inferiorly. The peritoneum was identified and entered bluntly. Peritoneal incision was extended longitudinally. The utero-vesical peritoneal reflection was incised transversely and a bladder flap was created digitally.  A low transverse hysterotomy was made. The fetus was delivered atraumatically. The umbilical cord was clamped x2 and cut and the infant was handed to the awaiting pediatricians. The placenta was removed  intact and appeared normal with a 3-vessel cord.  The uterus was exteriorized and cleared of all clot and debris. The hysterotomy was closed with running sutures of 0 Vicryl suture. A second imbricating layer was placed with the same suture. Excellent hemostasis was observed. The uterus was returned to the abdomen. The pelvis was irrigated and again, excellent hemostasis was noted.  The On Q Pain pump System was then placed.  Trocars were placed through the abdominal wall into the subfascial space and these were used to thread the silver soaker cathaters into place.The rectus fascia was then reapproximated with running sutures of Maxon, with careful placement not to incorporate the cathaters. Subcutaneous tissues are then irrigated with saline and hemostasis assured.  Skin is then closed with 4-0 vicryl suture in a subcuticular fashion followed by skin adhesive. The cathaters are flushed each with 5 mL of Bupivicaine and stabilized into place with dressing. Instrument, sponge, and needle counts were correct prior to the abdominal closure and at the conclusion of the case. The surgical assistant performed tissue retraction, assistance with suturing, and fundal pressure.   The patient tolerated the procedure well and was transferred to the recovery room in stable condition.   Annamarie Major, MD, [facility], [facility] 06/30/2020  5:19 PM

## 2020-06-30 NOTE — Anesthesia Procedure Notes (Signed)
Epidural Patient location during procedure: OB Start time: 06/30/2020 2:15 AM End time: 06/30/2020 2:39 AM  Staffing Anesthesiologist: Corinda Gubler, MD Performed: anesthesiologist   Preanesthetic Checklist Completed: patient identified, IV checked, site marked, risks and benefits discussed, surgical consent, monitors and equipment checked, pre-op evaluation and timeout performed  Epidural Patient position: sitting Prep: ChloraPrep Patient monitoring: heart rate, continuous pulse ox and blood pressure Approach: midline Location: L3-L4 Injection technique: LOR saline  Needle:  Needle type: Tuohy  Needle gauge: 17 G Needle length: 9 cm and 9 Needle insertion depth: 5 cm Catheter type: closed end flexible Catheter size: 19 Gauge Catheter at skin depth: 10 cm Test dose: negative and 1.5% lidocaine with Epi 1:200 K  Assessment Sensory level: T10 Events: blood not aspirated, injection not painful, no injection resistance, no paresthesia and negative IV test  Additional Notes 1st attempt Pt. Evaluated and documentation done after procedure finished. Patient identified. Risks/Benefits/Options discussed with patient including but not limited to bleeding, infection, nerve damage, paralysis, failed block, incomplete pain control, headache, blood pressure changes, nausea, vomiting, reactions to medication both or allergic, itching and postpartum back pain. Confirmed with bedside nurse the patient's most recent platelet count. Confirmed with patient that they are not currently taking any anticoagulation, have any bleeding history or any family history of bleeding disorders. Patient expressed understanding and wished to proceed. All questions were answered. Sterile technique was used throughout the entire procedure. Please see nursing notes for vital signs. Test dose was given through epidural catheter and negative prior to continuing to dose epidural or start infusion. Warning signs of high block  given to the patient including shortness of breath, tingling/numbness in hands, complete motor block, or any concerning symptoms with instructions to call for help. Patient was given instructions on fall risk and not to get out of bed. All questions and concerns addressed with instructions to call with any issues or inadequate analgesia.   Patient tolerated the insertion well without immediate complications.Reason for block:procedure for pain

## 2020-06-30 NOTE — Progress Notes (Signed)
Catherine Dunn is a 19 y.o. G1P0 at [redacted]w[redacted]d by ultrasound admitted for early labor and SROM  With noted meconium fluid. she is being augmented with pitocin..  Subjective: Very comfortable with epidural. Has been using the peanut labor ball, and changing from side to side.. Feeling some rectal pressure.   Objective: BP 92/62   Pulse 84   Temp 98.1 F (36.7 C) (Oral)   Resp 16   Ht 5\' 1"  (1.549 m)   Wt 71.2 kg   LMP 09/18/2019 (Exact Date)   SpO2 100%   BMI 29.66 kg/m  I/O last 3 completed shifts: In: 837.8 [I.V.:837.8] Out: 100 [Urine:100] No intake/output data recorded.  FHT:  FHR: 125 baseline bpm, variability: moderate,  accelerations:  Present,  decelerations:  Present several brief variable decels noted after cervical exam UC:   regular, every 2-3  minutes SVE:   Dilation: 7 Effacement (%): 80 Station: 0 Exam by:: [email or web address] CNM  Labs: Lab Results  Component Value Date   WBC 8.9 06/29/2020   HGB 11.4 (L) 06/29/2020   HCT 34.5 (L) 06/29/2020   MCV 85.2 06/29/2020   PLT 173 06/29/2020    Assessment / Plan: Augmentation of labor, progressing well  Labor: continues on pitocin with contractions measured internally.   Fetal Wellbeing:  Category II Pain Control:  Epidural I/D:  n/a Anticipated MOD:  NSVD   Willl continue with pitocin augmentation, following FHTs closely. Recheck in several hours or PRN.   08/29/2020 06/30/2020, 9:20 AM

## 2020-06-30 NOTE — Transfer of Care (Signed)
Immediate Anesthesia Transfer of Care Note  Patient: Catherine Dunn  Procedure(s) Performed: CESAREAN SECTION  Patient Location: LDR 3  Anesthesia Type:Spinal  Level of Consciousness: awake, alert  and oriented  Airway & Oxygen Therapy: Patient Spontanous Breathing  Post-op Assessment: Post -op Vital signs reviewed and stable  Post vital signs: stable  Last Vitals:  Vitals Value Taken Time  BP 114/46 06/30/20 1716  Temp    Pulse 92 06/30/20 1719  Resp 18 06/30/20 1719  SpO2 95 % 06/30/20 1719  Vitals shown include unvalidated device data.  Last Pain:  Vitals:   06/30/20 1715  TempSrc:   PainSc: 0-No pain         Complications: No complications documented.

## 2020-06-30 NOTE — Progress Notes (Signed)
Failure to progress  The risks of cesarean section discussed with the patient included but were not limited to: bleeding which may require transfusion or reoperation; infection which may require antibiotics; injury to bowel, bladder, ureters or other surrounding organs; injury to the fetus; need for additional procedures including hysterectomy in the event of a life-threatening hemorrhage; placental abnormalities wth subsequent pregnancies, incisional problems, thromboembolic phenomenon and other postoperative/anesthesia complications. The patient concurred with the proposed plan, giving informed written consent for the procedure.   Annamarie Major, MD, [facility], [facility] 06/30/2020  3:38 PM

## 2020-06-30 NOTE — Progress Notes (Signed)
  Labor Progress Note   19 y.o. year old G1P0 at [redacted]w[redacted]d weeks gestation admitted for Prelabor rupture of membranes  Subjective:  She is comfortable, breathing through contractions.   Objective:   Vitals:   06/29/20 2208 06/30/20 0049  BP: (!) 146/89   Pulse: (!) 103   Resp: 18   Temp: 98.4 F (36.9 C) 98 F (36.7 C)    Abd: non-tender Extr: no edema  EFM: FHR: 130 bpm, variability: moderate,  decelerations:  absent Toco: contractions every 2-3 minutes  SVE: 1/80/-3 IUPC placed easily.  Assessment & Plan:  19 y.o. year old G1P0 at [redacted]w[redacted]d weeks gestation admitted for prelabor rupture of membranes Pregnancy and Labor/Delivery Management  1. Pain management: patient has received stadol  2. FWB: FHT category 2 3. ID: GBS negative 4. Labor management: IUPC placed without complication, will start pitocin for augmentation of latent labor if contractions are not adequate.   All discussed with patient, see orders  Adelene Idler MD [facility], [facility] 06/30/2020 1:32 AM

## 2020-06-30 NOTE — Discharge Instructions (Signed)
Cesarean Delivery, Care After This sheet gives you information about how to care for yourself after your procedure. Your health care provider may also give you more specific instructions. If you have problems or questions, contact your health care provider. What can I expect after the procedure? After the procedure, it is common to have:  A small amount of blood or clear fluid coming from the incision.  Some redness, swelling, and pain in your incision area.  Some abdominal pain and soreness.  Vaginal bleeding (lochia). Even though you did not have a vaginal delivery, you will still have vaginal bleeding and discharge.  Pelvic cramps.  Fatigue. You may have pain, swelling, and discomfort in the tissue between your vagina and your anus (perineum) if:  Your C-section was unplanned, and you were allowed to labor and push.  An incision was made in the area (episiotomy) or the tissue tore during attempted vaginal delivery. Follow these instructions at home: Incision care   Follow instructions from your health care provider about how to take care of your incision. Make sure you: ? Wash your hands with soap and water before you change your bandage (dressing). If soap and water are not available, use hand sanitizer. ? If you have a dressing, change it or remove it as told by your health care provider. ? Leave stitches (sutures), skin staples, skin glue, or adhesive strips in place. These skin closures may need to stay in place for 2 weeks or longer. If adhesive strip edges start to loosen and curl up, you may trim the loose edges. Do not remove adhesive strips completely unless your health care provider tells you to do that.  Check your incision area every day for signs of infection. Check for: ? More redness, swelling, or pain. ? More fluid or blood. ? Warmth. ? Pus or a bad smell.  Do not take baths, swim, or use a hot tub until your health care provider says it's okay. Ask your health  care provider if you can take showers.  When you cough or sneeze, hug a pillow. This helps with pain and decreases the chance of your incision opening up (dehiscing). Do this until your incision heals. Medicines  Take over-the-counter and prescription medicines only as told by your health care provider.  If you were prescribed an antibiotic medicine, take it as told by your health care provider. Do not stop taking the antibiotic even if you start to feel better.  Do not drive or use heavy machinery while taking prescription pain medicine. Lifestyle  Do not drink alcohol. This is especially important if you are breastfeeding or taking pain medicine.  Do not use any products that contain nicotine or tobacco, such as cigarettes, e-cigarettes, and chewing tobacco. If you need help quitting, ask your health care provider. Eating and drinking  Drink at least 8 eight-ounce glasses of water every day unless told not to by your health care provider. If you breastfeed, you may need to drink even more water.  Eat high-fiber foods every day. These foods may help prevent or relieve constipation. High-fiber foods include: ? Whole grain cereals and breads. ? Brown rice. ? Beans. ? Fresh fruits and vegetables. Activity   If possible, have someone help you care for your baby and help with household activities for at least a few days after you leave the hospital.  Return to your normal activities as told by your health care provider. Ask your health care provider what activities are safe for   you.  Rest as much as possible. Try to rest or take a nap while your baby is sleeping.  Do not lift anything that is heavier than 10 lbs (4.5 kg), or the limit that you were told, until your health care provider says that it is safe.  Talk with your health care provider about when you can engage in sexual activity. This may depend on your: ? Risk of infection. ? How fast you heal. ? Comfort and desire to  engage in sexual activity. General instructions  Do not use tampons or douches until your health care provider approves.  Wear loose, comfortable clothing and a supportive and well-fitting bra.  Keep your perineum clean and dry. Wipe from front to back when you use the toilet.  If you pass a blood clot, save it and call your health care provider to discuss. Do not flush blood clots down the toilet before you get instructions from your health care provider.  Keep all follow-up visits for you and your baby as told by your health care provider. This is important. Contact a health care provider if:  You have: ? A fever. ? Bad-smelling vaginal discharge. ? Pus or a bad smell coming from your incision. ? Difficulty or pain when urinating. ? A sudden increase or decrease in the frequency of your bowel movements. ? More redness, swelling, or pain around your incision. ? More fluid or blood coming from your incision. ? A rash. ? Nausea. ? Little or no interest in activities you used to enjoy. ? Questions about caring for yourself or your baby.  Your incision feels warm to the touch.  Your breasts turn red or become painful or hard.  You feel unusually sad or worried.  You vomit.  You pass a blood clot from your vagina.  You urinate more than usual.  You are dizzy or light-headed. Get help right away if:  You have: ? Pain that does not go away or get better with medicine. ? Chest pain. ? Difficulty breathing. ? Blurred vision or spots in your vision. ? Thoughts about hurting yourself or your baby. ? New pain in your abdomen or in one of your legs. ? A severe headache.  You faint.  You bleed from your vagina so much that you fill more than one sanitary pad in one hour. Bleeding should not be heavier than your heaviest period. Summary  After the procedure, it is common to have pain at your incision site, abdominal cramping, and slight bleeding from your vagina.  Check  your incision area every day for signs of infection.  Tell your health care provider about any unusual symptoms.  Keep all follow-up visits for you and your baby as told by your health care provider. This information is not intended to replace advice given to you by your health care provider. Make sure you discuss any questions you have with your health care provider. Document Revised: 03/20/2018 Document Reviewed: 03/20/2018 Elsevier Patient Education  2020 [employer]  

## 2020-06-30 NOTE — Anesthesia Preprocedure Evaluation (Addendum)
Anesthesia Evaluation  Patient identified by MRN, date of birth, ID band Patient awake    Reviewed: Allergy & Precautions, NPO status , Patient's Chart, lab work & pertinent test results  History of Anesthesia Complications Negative for: history of anesthetic complications  Airway Mallampati: I  TM Distance: >3 FB Neck ROM: Full    Dental no notable dental hx. (+) Teeth Intact   Pulmonary neg pulmonary ROS, neg sleep apnea, neg COPD, Patient abstained from smoking.Not current smoker, former smoker,    Pulmonary exam normal breath sounds clear to auscultation       Cardiovascular Exercise Tolerance: Good METS(-) hypertension(-) CAD and (-) Past MI negative cardio ROS  (-) dysrhythmias  Rhythm:Regular Rate:Normal - Systolic murmurs    Neuro/Psych negative neurological ROS  negative psych ROS   GI/Hepatic neg GERD  ,(+)     (-) substance abuse  ,   Endo/Other  neg diabetes  Renal/GU negative Renal ROS     Musculoskeletal   Abdominal   Peds  Hematology   Anesthesia Other Findings Past Medical History: No date: COVID-19 virus infection No date: Gastroenteritis  Patient's epidural is on functioning on the left side, so plan to pull the epidural and do a low dose spinal for the c-section.  Reproductive/Obstetrics (+) Pregnancy                            Anesthesia Physical Anesthesia Plan  ASA: II  Anesthesia Plan: Spinal   Post-op Pain Management:    Induction:   PONV Risk Score and Plan: 3 and Treatment may vary due to age or medical condition and Ondansetron  Airway Management Planned: Natural Airway and Nasal Cannula  Additional Equipment:   Intra-op Plan:   Post-operative Plan:   Informed Consent: I have reviewed the patients History and Physical, chart, labs and discussed the procedure including the risks, benefits and alternatives for the proposed anesthesia with the  patient or authorized representative who has indicated his/her understanding and acceptance.       Plan Discussed with: Surgeon  Anesthesia Plan Comments: (Discussed R/B/A of neuraxial anesthesia technique with patient: - rare risks of spinal/epidural hematoma, nerve damage, infection - Risk of PDPH - Risk of itching - Risk of nausea and vomiting - Risk of poor block necessitating replacement of epidural. Patient voiced understanding.)       Anesthesia Quick Evaluation

## 2020-06-30 NOTE — Anesthesia Procedure Notes (Signed)
Spinal  Patient location during procedure: OR Start time: 06/30/2020 4:14 PM End time: 06/30/2020 4:20 PM Staffing Performed: resident/CRNA  Anesthesiologist: Martha Clan, MD Resident/CRNA: Aline Brochure, CRNA Preanesthetic Checklist Completed: patient identified, IV checked, site marked, risks and benefits discussed, surgical consent, monitors and equipment checked, pre-op evaluation and timeout performed Spinal Block Patient position: sitting Prep: ChloraPrep Patient monitoring: heart rate, continuous pulse ox, blood pressure and cardiac monitor Approach: midline Location: L3-4 Injection technique: single-shot Needle Needle type: Whitacre and Introducer  Needle gauge: 24 G Needle length: 9 cm Assessment Sensory level: T10 Additional Notes Negative paresthesia. Negative blood return. Positive free-flowing CSF. Expiration date of kit checked and confirmed. Patient tolerated procedure well, without complications.

## 2020-06-30 NOTE — Progress Notes (Signed)
  Labor Progress Note   19 y.o. G1P0 @ [redacted]w[redacted]d , admitted for  Pregnancy, Labor Management.   Subjective:  Mild crampy pains w ctxs Has epidural  IUPC in place, on Pitocin ROM since 2030 yesterday pm Noted meconium  Objective:  BP 118/79   Pulse 64   Temp 97.9 F (36.6 C) (Oral)   Resp 18   Ht 5\' 1"  (1.549 m)   Wt 71.2 kg   LMP 09/18/2019 (Exact Date)   SpO2 99%   BMI 29.66 kg/m  Abd: gravid, ND, FHT present, mild tenderness on exam Extr: trace to 1+ bilateral pedal edema SVE: CERVIX: 7-8 cm dilated, 90 effaced, 0 station, presenting part VTX  EFM: FHR: 150 bpm, variability: moderate,  accelerations:  Present,  decelerations:  Absent Toco: Frequency: Every 3-4 minutes Labs: I have reviewed the patient's lab results.   Assessment & Plan:  G1P0 @ [redacted]w[redacted]d, admitted for  Pregnancy and Labor/Delivery Management  1. Pain management: epidural. 2. FWB: FHT category 1.  3. ID: GBS negative 4. Labor management: Cont active mgt of labor w anticipated second stage soon  All discussed with patient, see orders  [redacted]w[redacted]d, MD, Annamarie Major Ob/Gyn, [facility] 06/30/2020  7:59 AM

## 2020-06-30 NOTE — Progress Notes (Signed)
Catherine Dunn is a 19 y.o. G1P0 at [redacted]w[redacted]d by ultrasound admitted for rupture of membranes  Subjective: She is feeling strong rectal pressure with each contraction. Her contractions are not painful; rather, the pressure is intense, and she is breathing through them intentionally.   Objective: BP 112/67 (BP Location: Left Arm)   Pulse (!) 124   Temp 98.6 F (37 C) (Oral)   Resp 20   Ht 5\' 1"  (1.549 m)   Wt 71.2 kg   LMP 09/18/2019 (Exact Date)   SpO2 98%   BMI 29.66 kg/m  I/O last 3 completed shifts: In: 837.8 [I.V.:837.8] Out: 100 [Urine:100] Total I/O In: -  Out: 1300 [Urine:1300]  FHT:  FHR: 140 baseline bpm, variability: moderate,  accelerations:  Present,  decelerations:  Present with some variable decels that have been addressed by amnioinfusion. UC:   regular, every 2  minutes. MVUs are now adequate SVE:   Dilation: 7.5 Effacement (%): 80, 90 Station: 0 Exam by:: [email or web address] CNM  LOP presentation  Labs: Lab Results  Component Value Date   WBC 8.9 06/29/2020   HGB 11.4 (L) 06/29/2020   HCT 34.5 (L) 06/29/2020   MCV 85.2 06/29/2020   PLT 173 06/29/2020    Assessment / Plan: augmentation of labor due to SROM,  Now with adequate MVUs, feeling strong rectal pressure. No cervical change over last few hours.  Labor: continue with adequate MVUs, amnioinfusion,, category 2 strip.  Fetal Wellbeing:  Category II Pain Control:  Epidural I/D:  n/a Anticipated MOD:  NSVD  Anesthesia contacted to bolus epidural. Use of the peanut ball and "Spinning Babies' techniques to facilitate internal rotation.  08/29/2020 06/30/2020, 12:01 PM

## 2020-07-01 ENCOUNTER — Encounter: Payer: Self-pay | Admitting: Obstetrics & Gynecology

## 2020-07-01 LAB — TYPE AND SCREEN
ABO/RH(D): AB POS
Antibody Screen: POSITIVE
Unit division: 0
Unit division: 0

## 2020-07-01 LAB — BPAM RBC
Blood Product Expiration Date: 202110302359
Blood Product Expiration Date: 202111052359
Unit Type and Rh: 6200
Unit Type and Rh: 6200

## 2020-07-01 LAB — CBC
HCT: 30.7 % — ABNORMAL LOW (ref 36.0–46.0)
Hemoglobin: 9.7 g/dL — ABNORMAL LOW (ref 12.0–15.0)
MCH: 27.8 pg (ref 26.0–34.0)
MCHC: 31.6 g/dL (ref 30.0–36.0)
MCV: 88 fL (ref 80.0–100.0)
Platelets: 152 10*3/uL (ref 150–400)
RBC: 3.49 MIL/uL — ABNORMAL LOW (ref 3.87–5.11)
RDW: 13.6 % (ref 11.5–15.5)
WBC: 16.4 10*3/uL — ABNORMAL HIGH (ref 4.0–10.5)
nRBC: 0 % (ref 0.0–0.2)

## 2020-07-01 NOTE — Anesthesia Postprocedure Evaluation (Signed)
Anesthesia Post Note  Patient: Catherine Dunn  Procedure(s) Performed: CESAREAN SECTION  Patient location during evaluation: Mother Baby Anesthesia Type: Epidural Level of consciousness: oriented and awake and alert Pain management: pain level controlled Vital Signs Assessment: post-procedure vital signs reviewed and stable Respiratory status: spontaneous breathing and respiratory function stable Cardiovascular status: blood pressure returned to baseline and stable Postop Assessment: no headache, no backache, no apparent nausea or vomiting and able to ambulate Anesthetic complications: no   No complications documented.   Last Vitals:  Vitals:   07/01/20 0400 07/01/20 0743  BP:  120/80  Pulse: 93 87  Resp:  18  Temp:  36.9 C  SpO2: 91% 98%    Last Pain:  Vitals:   07/01/20 0743  TempSrc: Oral  PainSc:                  Jules Schick

## 2020-07-01 NOTE — Lactation Note (Signed)
This note was copied from a baby's chart. Lactation Consultation Note  Patient Name: Catherine Dunn ENIDP'O Date: 07/01/2020 Reason for consult: Initial assessment;1st time breastfeeding;Term;Other (Comment) (c-section)  Initial lactation visit. Mom G1P1 delivered via c/s 18hrs ago. Initial RN care team started pumping and nipple shield due to inverted nipples. Mom voiced desire to pump and bottle feed due to latch difficulties. Mom was able to express total of 18mL and was given to baby via bottle with standard flow nipple. LC assisted with feeding this morning and spoke with mom about feeding plan moving forward. LC latched in modified cradle- with baby moving into his own position to sustain latch after a few attempts. Baby fed with identifiable swallows throughout 21 minute feeding, unlatched on his own, and was satisfied and sleeping skin to skin on mom's chest.  Mom desires feeding at the breast, but also expressing milk for bottle feeding. LC discussed feeding plan at this time: feed on left breast, attempt use of feeding on right with nipple shield, pumping post feeds. Alternate plan would include feeding on left breast and expressing right breast for milk storage. LC discussed ways to help improve latching with use of hand pump pre-feeds to pull nipple out, and hand expression of colostrum onto tip of nipple to encourage wide mouth.  Encouraged mom to call out for feeding assistance and/or pumping assistance as needed today.  Maternal Data Formula Feeding for Exclusion: No Has patient been taught Hand Expression?: Yes Does the patient have breastfeeding experience prior to this delivery?: No  Feeding Feeding Type: Breast Fed  LATCH Score Latch: Repeated attempts needed to sustain latch, nipple held in mouth throughout feeding, stimulation needed to elicit sucking reflex.  Audible Swallowing: Spontaneous and intermittent  Type of Nipple: Inverted (everts with  stimulation)  Comfort (Breast/Nipple): Soft / non-tender  Hold (Positioning): Assistance needed to correctly position infant at breast and maintain latch.  LATCH Score: 6  Interventions Interventions: Breast feeding basics reviewed;Assisted with latch;Hand express;Adjust position;Breast compression;Breast massage;Hand pump;DEBP  Lactation Tools Discussed/Used     Consult Status Consult Status: Follow-up Date: 07/01/20 Follow-up type: In-patient    Danford Bad 07/01/2020, 10:54 AM

## 2020-07-01 NOTE — Progress Notes (Signed)
Obstetric Postpartum/PostOperative Daily Progress Note Subjective:  19 y.o. G1P1001 post-operative day # 1 status post primary cesarean delivery for failure to progress in labor.  She is ambulating, is tolerating po, is voiding spontaneously.  Her pain is well controlled on PO pain medications and On Q pump. She reports breastfeeding is going ok and baby prefers to take pumped milk from bottle. Her lochia is less than menses.   Medications SCHEDULED MEDICATIONS  . bupivacaine  10 mL Infiltration Once  . ketorolac  30 mg Intravenous Q6H   Followed by  . ibuprofen  800 mg Oral Q6H  . measles, mumps & rubella vaccine  0.5 mL Subcutaneous Once  . prenatal multivitamin  1 tablet Oral Q1200  . senna-docusate  2 tablet Oral Q24H  . simethicone  80 mg Oral TID PC  . simethicone  80 mg Oral Q24H  . varicella virus vaccine live  0.5 mL Subcutaneous Once    MEDICATION INFUSIONS  . bupivacaine 0.25 % ON-Q pump DUAL CATH 400 mL    . bupivacaine ON-Q pain pump    . cefOXitin    . lactated ringers Stopped (07/01/20 0440)  . naLOXone [facility]) adult infusion for PRURITIS    . oxytocin 2.5 Units/hr (07/01/20 0002)    PRN MEDICATIONS  acetaminophen, coconut oil, witch hazel-glycerin **AND** dibucaine, diphenhydrAMINE **OR** diphenhydrAMINE, diphenhydrAMINE, ketorolac **OR** ketorolac, menthol-cetylpyridinium, morphine injection, nalbuphine **OR** nalbuphine, nalbuphine **OR** nalbuphine, naloxone **AND** sodium chloride flush, naLOXone (NARCAN) adult infusion for PRURITIS, ondansetron (ZOFRAN) IV, oxyCODONE, oxyCODONE-acetaminophen, simethicone, zolpidem    Objective:   Vitals:   07/01/20 0300 07/01/20 0330 07/01/20 0400 07/01/20 0743  BP:  132/90  120/80  Pulse: 91 98 93 87  Resp:  18  18  Temp:  98 F (36.7 C)  98.5 F (36.9 C)  TempSrc:  Oral  Oral  SpO2: 98% 97% 91% 98%  Weight:      Height:        Current Vital Signs 24h Vital Sign Ranges  T 98.5 F (36.9 C) Temp  Avg: 98.2 F (36.8  C)  Min: 97.7 F (36.5 C)  Max: 98.8 F (37.1 C)  BP 120/80 BP  Min: 107/71  Max: 140/89  HR 87 Pulse  Avg: 86.8  Min: 71  Max: 124  RR 18 Resp  Avg: 17.5  Min: 11  Max: 25  SaO2 98 % Room Air SpO2  Avg: 96.1 %  Min: 91 %  Max: 100 %       24 Hour I/O Current Shift I/O  Time Ins Outs 10/06 0701 - 10/07 0700 In: 3439.7 [I.V.:3312.9] Out: 2295 [Urine:1975] No intake/output data recorded.  General: NAD Pulmonary: no increased work of breathing Abdomen: non-distended, non-tender, fundus firm at level of umbilicus Inc: Clean/dry/intact, On Q pump intact Extremities: no edema, no erythema, no tenderness  Labs:  Recent Labs  Lab 06/29/20 2325 07/01/20 0347  WBC 8.9 16.4*  HGB 11.4* 9.7*  HCT 34.5* 30.7*  PLT 173 152     Assessment:   19 y.o. G1P1001 postoperative day # 1 status post primary cesarean section, lactating  Plan:  1) Acute blood loss anemia - hemodynamically stable and asymptomatic - po ferrous sulfate  2) AB POS Performed at [facility], [address] [place], [place] [address]  / Ishmael Holter <0.90 (02/26 1145)/ Varicella Not immune  3) TDAP status given antepartum  4) Breast/pumping/Contraception = undecided  5) Disposition: continue current care   Tresea Mall, CNM 07/01/2020 9:03 AM

## 2020-07-01 NOTE — Anesthesia Post-op Follow-up Note (Signed)
  Anesthesia Pain Follow-up Note  Patient: Catherine Dunn  Day #: 1  Date of Follow-up: 07/01/2020 Time: 7:57 AM  Last Vitals:  Vitals:   07/01/20 0400 07/01/20 0743  BP:  120/80  Pulse: 93 87  Resp:  18  Temp:  36.9 C  SpO2: 91% 98%    Level of Consciousness: alert  Pain: none   Side Effects:None  Catheter Site Exam:clean     Plan: D/C from anesthesia care at surgeon's request  Jules Schick

## 2020-07-02 ENCOUNTER — Inpatient Hospital Stay: Admit: 2020-07-02 | Payer: Self-pay

## 2020-07-02 ENCOUNTER — Encounter: Payer: Medicaid Other | Admitting: Obstetrics

## 2020-07-02 MED ORDER — OXYCODONE HCL 5 MG PO TABS
5.0000 mg | ORAL_TABLET | Freq: Four times a day (QID) | ORAL | 0 refills | Status: AC | PRN
Start: 2020-07-02 — End: 2020-07-07

## 2020-07-02 NOTE — Progress Notes (Signed)
Patient discharged home with infant. Discharge instructions and prescriptions given and reviewed with patient. Patient verbalized understanding. Escorted out by auxillary.  

## 2020-07-02 NOTE — Lactation Note (Addendum)
This note was copied from a baby's chart. Lactation Consultation Note  Patient Name: Catherine Dunn [id number] Date: 07/02/2020   Baby very fussy, won't latch per mom, just cries and pushes away  Maternal Data  Mom has inverted nipple on right and flat nipple on left  Feeding Feeding Type: Breast Fed Able to latch baby to left with use of 20 mm nipple shield and colostrum expressed into shield, baby will latch, suck a few times, calm and then pull off breast and cry, did this several times on both breasts x 10 min each breast, baby passing gas, calms with swaddling   LATCH Score                   Interventions    Lactation Tools Discussed/Used Nipple shield size: 20   Consult Status      Catherine Dunn 07/02/2020, 10:41 AM

## 2020-07-07 ENCOUNTER — Ambulatory Visit (INDEPENDENT_AMBULATORY_CARE_PROVIDER_SITE_OTHER): Payer: Medicaid Other | Admitting: Obstetrics & Gynecology

## 2020-07-07 ENCOUNTER — Encounter: Payer: Self-pay | Admitting: Obstetrics & Gynecology

## 2020-07-07 ENCOUNTER — Other Ambulatory Visit: Payer: Self-pay

## 2020-07-07 NOTE — Progress Notes (Signed)
  Postoperative Follow-up Patient presents post op from recent Cesarean Section performed for Arrest of Dilation, 1 week ago.   Subjective: Patient reports some improvement in her immediate post op symptoms. Eating a regular diet without difficulty. The patient is not having any pain.  Activity: sedentary. Patient reports additional symptom's since surgery of appropriate lochia, no signs of depression, and no signs of mastitis.  Objective: BP 120/80   Ht 5\' 1"  (1.549 m)   Wt 139 lb (63 kg)   BMI 26.26 kg/m  Physical Exam Constitutional:      General: She is not in acute distress.    Appearance: She is well-developed.  Cardiovascular:     Rate and Rhythm: Normal rate.  Pulmonary:     Effort: Pulmonary effort is normal.  Abdominal:     General: There is no distension.     Palpations: Abdomen is soft.     Tenderness: There is no abdominal tenderness.     Comments: Incision Healing Well   Musculoskeletal:        General: Normal range of motion.  Neurological:     Mental Status: She is alert and oriented to person, place, and time.     Cranial Nerves: No cranial nerve deficit.  Skin:    General: Skin is warm and dry.     Assessment: s/p : Cesarean Section for Arrest of Dilation progressing well  Plan: Patient has done well after her Cesarean Section with no apparent complications.  I have discussed the post-operative course to date, and the expected progress moving forward.  The patient understands what complications to be concerned about.  I will see the patient in routine follow up, or sooner if needed.    Activity plan: No heavy lifting.  Pelvic rest. She desires (uncertain, perhaps Nexplanon) for postpartum contraception.  Marland Kitchen 07/07/2020, 2:58 PM

## 2020-08-12 ENCOUNTER — Other Ambulatory Visit (HOSPITAL_COMMUNITY)
Admission: RE | Admit: 2020-08-12 | Discharge: 2020-08-12 | Disposition: A | Discharge status: Home / Self Care | Payer: Medicaid Other | Source: Ambulatory Visit | Attending: Obstetrics & Gynecology | Admitting: Obstetrics & Gynecology

## 2020-08-12 ENCOUNTER — Other Ambulatory Visit: Payer: Self-pay

## 2020-08-12 ENCOUNTER — Ambulatory Visit (INDEPENDENT_AMBULATORY_CARE_PROVIDER_SITE_OTHER): Payer: Medicaid Other | Admitting: Obstetrics & Gynecology

## 2020-08-12 ENCOUNTER — Encounter: Payer: Self-pay | Admitting: Obstetrics & Gynecology

## 2020-08-12 DIAGNOSIS — Z30017 Encounter for initial prescription of implantable subdermal contraceptive: Secondary | ICD-10-CM

## 2020-08-12 DIAGNOSIS — Z124 Encounter for screening for malignant neoplasm of cervix: Secondary | ICD-10-CM | POA: Diagnosis present

## 2020-08-12 DIAGNOSIS — Z3009 Encounter for other general counseling and advice on contraception: Secondary | ICD-10-CM

## 2020-08-12 MED ORDER — CEPHALEXIN 500 MG PO CAPS: 500.0000 mg | ORAL_CAPSULE | Freq: Four times a day (QID) | ORAL | 2 refills | Status: DC

## 2020-08-12 MED ORDER — FLUCONAZOLE 150 MG PO TABS: 150.0000 mg | ORAL_TABLET | Freq: Once | ORAL | 3 refills | Status: AC

## 2020-08-12 NOTE — Progress Notes (Signed)
  OBSTETRICS POSTPARTUM CLINIC PROGRESS NOTE  Subjective:     Catherine Dunn is a 19 y.o. G59P1001 female who presents for a postpartum visit. She is 6 weeks postpartum following a Term pregnancy and delivery by C-section failure to progress.  I have fully reviewed the prenatal and intrapartum course. Anesthesia: epidural.  Postpartum course has been complicated by uncomplicated.  Baby is feeding by Bottle.  Bleeding: patient has not  resumed menses.  Bowel function is normal. Bladder function is normal.  Patient is not sexually active. Contraception method desired is Nexplanon.  Postpartum depression screening: negative. Edinburgh 2.  The following portions of the patient's history were reviewed and updated as appropriate: allergies, current medications, past family history, past medical history, past social history, past surgical history and problem list.  Review of Systems Pertinent items are noted in HPI.  Objective:    BP 110/70   Ht 5\' 1"  (1.549 m)   Wt 140 lb (63.5 kg)   BMI 26.45 kg/m   General:  alert and no distress   Breasts:  inspection negative, no nipple discharge or bleeding, no masses or nodularity palpable  Lungs: clear to auscultation bilaterally  Heart:  regular rate and rhythm, S1, S2 normal, no murmur, click, rub or gallop  Abdomen: soft, non-tender; bowel sounds normal; no masses,  no organomegaly. Pfannenstiel incision with small area of drainage that is odorous, min skin separation   Vulva:  normal  Vagina: normal vagina, no discharge, exudate, lesion, or erythema  Cervix:  no cervical motion tenderness and no lesions  Corpus: normal size, contour, position, consistency, mobility, non-tender  Adnexa:  normal adnexa and no mass, fullness, tenderness  Rectal Exam: Not performed.          Assessment:  Post Partum Care visit 1. Postpartum care following cesarean delivery Skin cellulitis at incision Keflex Diflucan prophylaxis  2. Contraceptive use  education Plans Nexplanon See below  3. Screening for cervical cancer - Cytology - PAP - First PAP.  DIscussed its purpose and follow up.  4. Nexplanon insertion  Plan:  See orders and Patient Instructions Follow up in: 2 weeks or as needed.   Nexplanon Insertion  Patient given informed consent, signed copy in the chart, time out was performed. Appropriate time out taken.  Patient's LEFT arm was prepped and draped in the usual sterile fashion.. The ruler used to measure and mark insertion area.  Pt was prepped with betadine swab and then injected with 3 cc of 2% lidocaine with epinephrine. Nexplanon removed form packaging,  Device confirmed in needle, then inserted full length of needle and withdrawn per handbook instructions.  Pt insertion site covered with steri-strip and a bandage.   Minimal blood loss.  Pt tolerated the procedure well.   , MD, Annamarie Major Ob/Gyn, [facility] 08/12/2020  2:28 PM

## 2020-08-12 NOTE — Patient Instructions (Signed)

## 2020-08-16 LAB — CYTOLOGY - PAP
Chlamydia: NEGATIVE
Comment: NEGATIVE
Comment: NORMAL
Diagnosis: NEGATIVE
Neisseria Gonorrhea: NEGATIVE

## 2020-08-25 ENCOUNTER — Encounter: Payer: Self-pay | Admitting: Obstetrics & Gynecology

## 2020-08-25 ENCOUNTER — Ambulatory Visit (INDEPENDENT_AMBULATORY_CARE_PROVIDER_SITE_OTHER): Payer: Medicaid Other | Admitting: Obstetrics & Gynecology

## 2020-08-25 ENCOUNTER — Other Ambulatory Visit: Payer: Self-pay

## 2020-08-25 VITALS — Ht 61.0 in

## 2020-08-25 DIAGNOSIS — Z3046 Encounter for surveillance of implantable subdermal contraceptive: Secondary | ICD-10-CM | POA: Diagnosis not present

## 2020-08-25 NOTE — Progress Notes (Signed)
Virtual Visit via Telephone Note  I connected with Catherine Dunn on 08/25/20 at  8:30 AM EST by telephone and verified that I am speaking with the correct person using two identifiers.  Location: Patient: Home Provider: Office   I discussed the limitations, risks, security and privacy concerns of performing an evaluation and management service by telephone and the availability of in person appointments. I also discussed with the patient that there may be a patient responsible charge related to this service. The patient expressed understanding and agreed to proceed.  History of Present Illness: Catherine Dunn is a 19 y.o. that had a Nexplanon subdermal implant placed approximately 2 weeks ago. Since that time, she states that she has not had irregular bleeding, and has not had pain in her arm or fingers. Incisional concerns: No.  PMHx: She  has a past medical history of COVID-19 virus infection and Gastroenteritis. Also,  has a past surgical history that includes Wisdom tooth extraction and Cesarean section (06/30/2020)., family history includes COPD in her mother; Diverticulitis in her mother; Heart attack in her mother; Hypertension in her mother; Ovarian cancer in her mother.,  reports that she has quit smoking. Her smoking use included e-cigarettes. She has never used smokeless tobacco. She reports previous alcohol use. She reports that she does not use drugs.  She has a current medication list which includes the following prescription(s): cephalexin, nexplanon, and prenatal vitamins. Also, has No Known Allergies.  Review of Systems  All other systems reviewed and are negative.  Observations/Objective: No exam today, due to telephone eVisit due to [facility] virus restriction on elective visits and procedures.  Prior visits reviewed along with ultrasounds/labs as indicated.  Assessment and Plan:   ICD-10-CM   1. Encounter for surveillance of implantable subdermal contraceptive  Z30.46   She  was told to continue to use barrier contraception, in order to prevent any STIs, and to take a home pregnancy test or call us if she ever thinks she may be pregnant, and that her Nexplanon expires in 3 years.  Follow Up Instructions: PRN and Yearly 07/2021   I discussed the assessment and treatment plan with the patient. The patient was provided an opportunity to ask questions and all were answered. The patient agreed with the plan and demonstrated an understanding of the instructions.   The patient was advised to call back or seek an in-person evaluation if the symptoms worsen or if the condition fails to improve as anticipated.  I provided 15 minutes of non-face-to-face time during this encounter.   Letitia Libra, MD

## 2020-10-04 ENCOUNTER — Other Ambulatory Visit: Payer: Self-pay

## 2020-10-06 ENCOUNTER — Encounter: Payer: Self-pay | Admitting: Obstetrics & Gynecology

## 2020-10-06 ENCOUNTER — Ambulatory Visit (INDEPENDENT_AMBULATORY_CARE_PROVIDER_SITE_OTHER): Payer: Medicaid Other | Admitting: Obstetrics & Gynecology

## 2020-10-06 ENCOUNTER — Other Ambulatory Visit: Payer: Self-pay

## 2020-10-06 VITALS — BP 90/56 | Ht 61.0 in | Wt 140.0 lb

## 2020-10-06 DIAGNOSIS — Z7689 Persons encountering health services in other specified circumstances: Secondary | ICD-10-CM

## 2020-10-06 NOTE — Telephone Encounter (Signed)
Sch appt

## 2020-10-06 NOTE — Telephone Encounter (Signed)
Patient is scheduled 10/06/20 with [facility]

## 2020-10-06 NOTE — Progress Notes (Signed)
HPI:      Catherine Dunn is a 20 y.o. G1P1001 who LMP was Patient's last menstrual period was 10/05/2020 (exact date)., she presents today for her return to work wellness examination. The patient has no complaints today. The patient is sexually active. Her last pap: was normal. The patient does perform self breast exams.  There is no notable family history of breast or ovarian cancer in her family.  The patient has regular exercise: yes.  The patient denies current symptoms of depression.    GYN History: Contraception: Nexplanon  PMHx: Past Medical History:  Diagnosis Date  . COVID-19 virus infection   . Gastroenteritis    Past Surgical History:  Procedure Laterality Date  . CESAREAN SECTION  06/30/2020   Procedure: CESAREAN SECTION;  Surgeon: Nadara Mustard, MD;  Location: [facility] ORS;  Service: Obstetrics;;  . WISDOM TOOTH EXTRACTION     Family History  Problem Relation Age of Onset  . COPD Mother   . Heart attack Mother   . Diverticulitis Mother   . Hypertension Mother   . Ovarian cancer Mother    Social History   Tobacco Use  . Smoking status: Former Smoker    Types: E-cigarettes  . Smokeless tobacco: Never Used  Vaping Use  . Vaping Use: Former  Substance Use Topics  . Alcohol use: Not Currently  . Drug use: No    Current Outpatient Medications:  .  etonogestrel (NEXPLANON) 68 MG IMPL implant, 1 each by Subdermal route once., Disp: , Rfl:  Allergies: Patient has no known allergies.  Review of Systems  Constitutional: Negative for chills, fever and malaise/fatigue.  HENT: Negative for congestion, sinus pain and sore throat.   Eyes: Negative for blurred vision and pain.  Respiratory: Negative for cough and wheezing.   Cardiovascular: Negative for chest pain and leg swelling.  Gastrointestinal: Negative for abdominal pain, constipation, diarrhea, heartburn, nausea and vomiting.  Genitourinary: Negative for dysuria, frequency, hematuria and urgency.   Musculoskeletal: Negative for back pain, joint pain, myalgias and neck pain.  Skin: Negative for itching and rash.  Neurological: Negative for dizziness, tremors and weakness.  Endo/Heme/Allergies: Does not bruise/bleed easily.  Psychiatric/Behavioral: Negative for depression. The patient is not nervous/anxious and does not have insomnia.     Objective: BP (!) 90/56   Ht 5\' 1"  (1.549 m)   Wt 140 lb (63.5 kg)   LMP 10/05/2020 (Exact Date)   Breastfeeding No   BMI 26.45 kg/m   Filed Weights   10/06/20 1550  Weight: 140 lb (63.5 kg)   Body mass index is 26.45 kg/m. Physical Exam Constitutional:      General: She is not in acute distress.    Appearance: She is well-developed and well-nourished.  HENT:     Head: Normocephalic and atraumatic. No laceration.     Right Ear: Hearing normal.     Left Ear: Hearing normal.     Nose: No epistaxis or foreign body.     Mouth/Throat:     Mouth: Oropharynx is clear and moist and mucous membranes are normal.     Pharynx: Uvula midline.  Eyes:     Pupils: Pupils are equal, round, and reactive to light.  Neck:     Thyroid: No thyromegaly.  Cardiovascular:     Rate and Rhythm: Normal rate and regular rhythm.     Heart sounds: No murmur heard. No friction rub. No gallop.   Pulmonary:     Effort: Pulmonary effort is normal.  No respiratory distress.     Breath sounds: Normal breath sounds. No wheezing.  Abdominal:     General: Bowel sounds are normal. There is no distension.     Palpations: Abdomen is soft.     Tenderness: There is no abdominal tenderness. There is no rebound.  Musculoskeletal:        General: Normal range of motion.     Cervical back: Normal range of motion and neck supple.  Neurological:     Mental Status: She is alert and oriented to person, place, and time.     Cranial Nerves: No cranial nerve deficit.  Skin:    General: Skin is warm and dry.  Psychiatric:        Mood and Affect: Mood and affect normal.         Judgment: Judgment normal.  Vitals reviewed.     Assessment:  ANNUAL EXAM 1. Return to work exam   No limitations for work PAP due 07/2021 Cont Nexplanon for birth control    F/U  Return if symptoms worsen or fail to improve, for and Annual when due.  Annamarie Major, MD, Merlinda Frederick Ob/Gyn, [facility] 10/06/2020  4:01 PM

## 2020-11-16 ENCOUNTER — Other Ambulatory Visit (HOSPITAL_COMMUNITY)
Admission: RE | Admit: 2020-11-16 | Discharge: 2020-11-16 | Disposition: A | Discharge status: Home / Self Care | Payer: Medicaid Other | Source: Ambulatory Visit | Attending: Obstetrics & Gynecology | Admitting: Obstetrics & Gynecology

## 2020-11-16 ENCOUNTER — Ambulatory Visit (INDEPENDENT_AMBULATORY_CARE_PROVIDER_SITE_OTHER): Payer: Medicaid Other | Admitting: Obstetrics & Gynecology

## 2020-11-16 ENCOUNTER — Other Ambulatory Visit: Payer: Self-pay

## 2020-11-16 ENCOUNTER — Encounter: Payer: Self-pay | Admitting: Obstetrics & Gynecology

## 2020-11-16 VITALS — BP 120/80 | Ht 61.0 in | Wt 142.0 lb

## 2020-11-16 DIAGNOSIS — N9411 Superficial (introital) dyspareunia: Secondary | ICD-10-CM | POA: Diagnosis present

## 2020-11-16 NOTE — Progress Notes (Signed)
Obstetrics & Gynecology Office Visit    Chief Complaint  Patient presents with  . Dyspareunia   History of Present Illness: 20 y.o. G1P1001 presenting for initial evaluation of dyspareunia.  Symptoms onset was since she started back trying to be sexaully active post partum for a CS several months ago and symptoms have been stable since onset.    The patient is not menopausal.  She is currently on Nexplanon hormonal medications.  She has been having prolonged bleeding related to the use of Nexplanon, still waiting for that to improve vs changing modalities.  Breast feeding.   Good relationship w spouse and low stress level at home.  Pain is most pronounced with initial penetration.   Raw burning feeling.  Does not feel overly dry.  No deep pain.  She denies postcoital spotting.  Last pap smear on 07/2020 was NIL and HR HPV negative.  She denies a history of sexually transmitted infections.  Associated symptoms include none.  She denies recent antibiotic exposure, denies changes in soaps, detergents coinciding with the onset of her symptoms.  She has not previously self treated or been under treatment by another provider for these symptoms.   Review of Systems  Constitutional: Negative for chills, fever and malaise/fatigue.  HENT: Negative for congestion, sinus pain and sore throat.   Eyes: Negative for blurred vision and pain.  Respiratory: Negative for cough and wheezing.   Cardiovascular: Negative for chest pain and leg swelling.  Gastrointestinal: Negative for abdominal pain, constipation, diarrhea, heartburn, nausea and vomiting.  Genitourinary: Negative for dysuria, frequency, hematuria and urgency.  Musculoskeletal: Negative for back pain, joint pain, myalgias and neck pain.  Skin: Negative for itching and rash.  Neurological: Negative for dizziness, tremors and weakness.  Endo/Heme/Allergies: Does not bruise/bleed easily.  Psychiatric/Behavioral: Negative for depression. The  patient is not nervous/anxious and does not have insomnia.     Past Medical History:  Past Medical History:  Diagnosis Date  . COVID-19 virus infection   . Gastroenteritis     Past Surgical History:  Past Surgical History:  Procedure Laterality Date  . CESAREAN SECTION  06/30/2020   Procedure: CESAREAN SECTION;  Surgeon: Nadara Mustard, MD;  Location: [facility] ORS;  Service: Obstetrics;;  . [place] TOOTH EXTRACTION      Gynecologic History: Patient's last menstrual period was 10/05/2020.  Obstetric History: G1P1001  Family History:  Family History  Problem Relation Age of Onset  . COPD Mother   . Heart attack Mother   . Diverticulitis Mother   . Hypertension Mother   . Ovarian cancer Mother     Social History:  Social History   Socioeconomic History  . Marital status: Single    Spouse name: Not on file  . Number of children: Not on file  . Years of education: Not on file  . Highest education level: Not on file  Occupational History  . Not on file  Tobacco Use  . Smoking status: Former Smoker    Types: E-cigarettes  . Smokeless tobacco: Never Used  Vaping Use  . Vaping Use: Former  Substance and Sexual Activity  . Alcohol use: Not Currently  . Drug use: No  . Sexual activity: Yes    Birth control/protection: Implant  Other Topics Concern  . Not on file  Social History Narrative  . Not on file   Social Determinants of Health   Financial Resource Strain: Not on file  Food Insecurity: Not on file  Transportation Needs:  Not on file  Physical Activity: Not on file  Stress: Not on file  Social Connections: Not on file  Intimate Partner Violence: Not on file    Allergies:  No Known Allergies  Medications: Prior to Admission medications   Medication Sig Start Date End Date Taking? Authorizing Provider  etonogestrel (NEXPLANON) 68 MG IMPL implant 1 each by Subdermal route once.   Yes [provider]    Physical Exam Blood pressure 120/80,  height 5\' 1"  (1.549 m), weight 142 lb (64.4 kg), last menstrual period 10/05/2020, not currently breastfeeding.  Patient's last menstrual period was 10/05/2020.  General: NAD HEENT: normocephalic, anicteric Thyroid: no enlargement, no palpable nodules Pulmonary: No increased work of breathing Cardiovascular: RRR, distal pulses 2+ Abdomen: NABS, soft, non-tender, non-distended.  Umbilicus without lesions.  No hepatomegaly, splenomegaly or masses palpable. No evidence of hernia  Genitourinary:  External: Normal external female genitalia.  Normal urethral meatus, normal  Bartholin's and Skene's glands.    Vagina: Normal vaginal mucosa, no evidence of prolapse.    Cervix: Grossly normal in appearance, no bleeding  Uterus: Non-enlarged, mobile, normal contour.  No CMT  Adnexa: ovaries non-enlarged, no adnexal masses  Rectal: deferred  Lymphatic: no evidence of inguinal lymphadenopathy Extremities: no edema, erythema, or tenderness Neurologic: Grossly intact Psychiatric: mood appropriate, affect full  Female chaperone present for pelvic  portions of the physical exam  Assessment: 20 y.o. G1P1001   Introital dyspareunia    -  Primary   Relevant Orders   Cervicovaginal ancillary only Test for infection whether BV or other Relationship to childbirth w prolonged dypareunia discussed Relationship of prolonged bleeding and its tissue effect discussed Alternative etiologies also discussed  Monitor and certainly treat any culture results Consider hormone w estrogen to improve bleeding and lessen agitation to tissues Hormones themselves should not cause dyspareunia, she is counseled F/u virtual appt     A total of 20 minutes were spent face-to-face with the patient as well as preparation, review, communication, and documentation during this encounter.   34, MD, Annamarie Major Ob/Gyn, [facility] 11/16/2020  3:05 PM

## 2020-11-16 NOTE — Patient Instructions (Signed)
Dyspareunia, Female Dyspareunia is pain that is associated with sexual activity. This can affect any part of the genitals or lower abdomen. There are many possible causes of this condition. In some cases, diagnosing the cause of dyspareunia can be difficult. This condition can be mild, moderate, or severe. Depending on the cause, dyspareunia may get better with treatment, but may return (recur) over time. What are the causes? The cause of this condition is not always known. However, problems that affect the vulva, vagina, uterus, and other organs may cause dyspareunia. Common causes of this condition include:  Severe pain and tenderness of the vulva when it is touched (vulvodynia).  Vaginal dryness.  Giving birth.  Infection.  Skin changes or conditions.  Side effects of medicines.  Endometriosis. This is when tissue that is like the lining of the uterus grows on the outside of the uterus.  Psychological conditions. These include depression, anxiety, or traumatic experiences.  Allergic reaction.   What increases the risk? The following factors may make you more likely to develop this condition:  History of physical or sexual trauma.  Some medicines.  No longer having a monthly period (menopause).  Having recently given birth.  Taking baths using soaps that have perfumes. These can cause irritation.  Douching. What are the signs or symptoms? The main symptom of this condition is pain in any part of your genitals or lower abdomen during or after sex. This may include:  Irritation, burning, or stinging sensations in your vulva.  Discomfort when your vulva or surrounding area is touched.  Aching and throbbing pain that may be constant.  Pain that gets worse when something is inserted into your vagina. How is this diagnosed? This condition may be diagnosed based on:  Your symptoms, including where and when your pain occurs.  Your medical history.  A physical exam. A  pelvic exam will most likely be done.  Tests that include ultrasound, blood tests, and tests that check the body for infection.  Imaging tests, such as X-ray, MRI, and CT scan. You may be referred to a health care provider who specializes in women's health (gynecologist). How is this treated? Treatment depends on the cause of your condition and your symptoms. In most cases, you may need to stop sexual activity until your symptoms go away or get better. Treatment may include:  Lubricants, ointments, and creams.  Physical therapy.  Massage therapy.  Hormonal therapy.  Medicines to: ? Prevent or fight infection. ? Relieve pain. ? Help numb the area. ? Treat depression (antidepressants).  Counseling, which may include sex therapy.  Surgery. Follow these instructions at home: Lifestyle  Wear cotton underwear.  Use water-based lubricants as needed during sex. Avoid oil-based lubricants.  Do not use any products that can cause irritation. This may include certain condoms, spermicides, lubricants, soaps, tampons, vaginal sprays, or douches.  Always practice safe sex. Use a condom to prevent sexually transmitted infections (STIs).  Talk freely with your partner about your condition. General instructions  Take or apply over-the-counter and prescription medicines only as told by your health care provider.  Urinate before you have sex.  Consider joining a support group.  Get the results of any tests you have done. Ask your health care provider, or the department that is doing the procedure, when your results will be ready.  Keep all follow-up visits as told by your health care provider. This is important. Contact a health care provider if:  You have vaginal bleeding after having sex.    You develop a lump at the opening of your vagina even if the lump is painless.  You have: ? Abnormal discharge from your vagina. ? Vaginal dryness. ? Itchiness or irritation of your vulva  or vagina. ? A new rash. ? Symptoms that get worse or do not improve with treatment. ? A fever. ? Pain when you urinate. ? Blood in your urine. Get help right away if:  You have severe pain in your abdomen during or shortly after sex.  You pass out after sex. Summary  Dyspareunia is pain that is associated with sexual activity. This can affect any part of the genitals or lower abdomen.  There are many causes of this condition. Treatment depends on the cause and your symptoms. In most cases, you may need to stop sexual activity until your symptoms improve.  Take or apply over-the-counter and prescription medicines only as told by your health care provider.  Contact a health care provider if your symptoms get worse or do not improve with treatment.  Keep all follow-up visits as told by your health care provider. This is important. This information is not intended to replace advice given to you by your health care provider. Make sure you discuss any questions you have with your health care provider. Document Revised: 10/23/2019 Document Reviewed: 11/18/2018 Elsevier Patient Education  2021 [employer]  

## 2020-11-18 ENCOUNTER — Other Ambulatory Visit: Payer: Self-pay | Admitting: Obstetrics & Gynecology

## 2020-11-18 LAB — CERVICOVAGINAL ANCILLARY ONLY
Bacterial Vaginitis (gardnerella): POSITIVE — AB
Candida Glabrata: NEGATIVE
Candida Vaginitis: NEGATIVE
Chlamydia: NEGATIVE
Comment: NEGATIVE
Comment: NEGATIVE
Comment: NEGATIVE
Comment: NEGATIVE
Comment: NEGATIVE
Comment: NORMAL
Neisseria Gonorrhea: NEGATIVE
Trichomonas: NEGATIVE

## 2020-11-18 MED ORDER — METRONIDAZOLE 0.75 % VA GEL: 1.0000 | Freq: Every day | VAGINAL | 0 refills | Status: AC

## 2020-12-14 ENCOUNTER — Encounter: Payer: Self-pay | Admitting: Obstetrics & Gynecology

## 2020-12-14 ENCOUNTER — Telehealth (INDEPENDENT_AMBULATORY_CARE_PROVIDER_SITE_OTHER): Payer: Medicaid Other | Admitting: Obstetrics & Gynecology

## 2020-12-14 DIAGNOSIS — N760 Acute vaginitis: Secondary | ICD-10-CM

## 2020-12-14 DIAGNOSIS — N9411 Superficial (introital) dyspareunia: Secondary | ICD-10-CM | POA: Diagnosis not present

## 2020-12-14 DIAGNOSIS — B9689 Other specified bacterial agents as the cause of diseases classified elsewhere: Secondary | ICD-10-CM | POA: Diagnosis not present

## 2020-12-14 NOTE — Progress Notes (Signed)
Virtual Visit via Video Note  I connected with Catherine Dunn on 12/14/20 at  9:20 AM EDT by a video enabled telemedicine application and verified that I am speaking with the correct person using two identifiers.  Location: Patient: Home Provider: Office   I discussed the limitations of evaluation and management by telemedicine and the availability of in person appointments. The patient expressed understanding and agreed to proceed.  History of Present Illness: Catherine Dunn is a 20 y.o. who was started on BV ABX after it was diagnosed ;last month, in her investigation for dyspareunia.  She has felt better from pain, and no burning or discharge or itching.  She reports mostly spotting this month as far as periods are concerned, has Nexplanon and prior was having irreg and heavy bleeding.   PMHx: She  has a past medical history of COVID-19 virus infection and Gastroenteritis. Also,  has a past surgical history that includes Wisdom tooth extraction and Cesarean section (06/30/2020)., family history includes COPD in her mother; Diverticulitis in her mother; Heart attack in her mother; Hypertension in her mother; Ovarian cancer in her mother.,  reports that she has quit smoking. Her smoking use included e-cigarettes. She has never used smokeless tobacco. She reports previous alcohol use. She reports that she does not use drugs. No outpatient medications have been marked as taking for the 12/14/20 encounter (Video Visit) with Nadara Mustard, MD.  . Also, has No Known Allergies..  Review of Systems  All other systems reviewed and are negative.   Observations/Objective: No exam today, due to telephone eVisit due to [facility] virus restriction on elective visits and procedures.  Prior visits reviewed along with ultrasounds/labs as indicated.  Assessment and Plan:   ICD-10-CM   1. Introital dyspareunia  N94.11   2. 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 The patient was provided an opportunity to ask questions and all were answered. The patient agreed with the plan and demonstrated an understanding of the instructions.   The patient was advised to call back or seek an in-person evaluation if the symptoms worsen or if the condition fails to improve as anticipated.  A total of 15 minutes were spent face-to-face with the patient as well as preparation, review, communication, and documentation during this encounter.   Annamarie Major, MD, [facility], [facility] 12/14/2020  9:49 AM

## 2021-01-31 ENCOUNTER — Ambulatory Visit (INDEPENDENT_AMBULATORY_CARE_PROVIDER_SITE_OTHER): Payer: Medicaid Other | Admitting: Obstetrics & Gynecology

## 2021-01-31 ENCOUNTER — Other Ambulatory Visit: Payer: Self-pay

## 2021-01-31 ENCOUNTER — Encounter: Payer: Self-pay | Admitting: Obstetrics & Gynecology

## 2021-01-31 VITALS — BP 100/70 | Ht 62.0 in | Wt 149.0 lb

## 2021-01-31 DIAGNOSIS — Z30016 Encounter for initial prescription of transdermal patch hormonal contraceptive device: Secondary | ICD-10-CM

## 2021-01-31 DIAGNOSIS — N926 Irregular menstruation, unspecified: Secondary | ICD-10-CM | POA: Diagnosis not present

## 2021-01-31 MED ORDER — XULANE 150-35 MCG/24HR TD PTWK: 1.0000 | MEDICATED_PATCH | TRANSDERMAL | 3 refills | Status: DC

## 2021-01-31 NOTE — Progress Notes (Signed)
  Nexplanon removal Procedure note - The Nexplanon was noted in the patient's arm and the end was identified. The skin was cleansed with a Betadine solution. A small injection of subcutaneous lidocaine with epinephrine was given over the end of the implant. An incision was made at the end of the implant. The rod was noted in the incision and grasped with a hemostat. It was noted to be intact.  Steri-Strip was placed approximating the incision. Hemostasis was noted.   Xulane/OCPs The risks /benefits of OCPs, rings, and patches have been explained to the patient in detail.  Product literature has been given to her.  I have instructed her in the use of Xulane patch and have given her literature reinforcing this information.  I have explained to the patient that OCPs and patches are not as effective for birth control during the first month of use, and that another form of contraception should be used during this time.  Both first-day start and Sunday start have been explained.  The risks and benefits of each was discussed.  She has been made aware of  the fact that other medications may affect the efficacy of OCPs, and also to be aware of any skin changes w the patch.  I have answered all of her questions, and I believe that she has an understanding of the effectiveness and use of OCPs as well as Xulane patch.   Letitia Libra ,MD 01/31/2021,10:02 AM

## 2021-05-05 ENCOUNTER — Other Ambulatory Visit: Payer: Self-pay | Admitting: Obstetrics & Gynecology

## 2021-05-05 DIAGNOSIS — N926 Irregular menstruation, unspecified: Secondary | ICD-10-CM

## 2021-05-09 ENCOUNTER — Other Ambulatory Visit: Payer: Self-pay

## 2021-05-09 ENCOUNTER — Other Ambulatory Visit: Payer: Medicaid Other

## 2021-05-09 DIAGNOSIS — N926 Irregular menstruation, unspecified: Secondary | ICD-10-CM

## 2021-05-10 LAB — BETA HCG QUANT (REF LAB): hCG Quant: <1 m[IU]/mL

## 2021-06-17 ENCOUNTER — Other Ambulatory Visit: Payer: Self-pay | Admitting: Obstetrics & Gynecology

## 2021-07-03 ENCOUNTER — Other Ambulatory Visit: Payer: Self-pay | Admitting: Obstetrics & Gynecology

## 2021-07-03 DIAGNOSIS — Z30016 Encounter for initial prescription of transdermal patch hormonal contraceptive device: Secondary | ICD-10-CM

## 2021-07-03 MED ORDER — XULANE 150-35 MCG/24HR TD PTWK: 1.0000 | MEDICATED_PATCH | TRANSDERMAL | 1 refills | Status: DC

## 2021-09-06 ENCOUNTER — Other Ambulatory Visit (HOSPITAL_COMMUNITY)
Admission: RE | Admit: 2021-09-06 | Discharge: 2021-09-06 | Disposition: A | Discharge status: Home / Self Care | Payer: Medicaid Other | Source: Ambulatory Visit | Attending: Obstetrics & Gynecology | Admitting: Obstetrics & Gynecology

## 2021-09-06 ENCOUNTER — Ambulatory Visit (INDEPENDENT_AMBULATORY_CARE_PROVIDER_SITE_OTHER): Payer: Medicaid Other | Admitting: Obstetrics & Gynecology

## 2021-09-06 ENCOUNTER — Other Ambulatory Visit: Payer: Self-pay

## 2021-09-06 ENCOUNTER — Encounter: Payer: Self-pay | Admitting: Obstetrics & Gynecology

## 2021-09-06 VITALS — BP 120/80 | Ht 62.0 in | Wt 166.0 lb

## 2021-09-06 DIAGNOSIS — Z01419 Encounter for gynecological examination (general) (routine) without abnormal findings: Secondary | ICD-10-CM | POA: Diagnosis not present

## 2021-09-06 DIAGNOSIS — B9689 Other specified bacterial agents as the cause of diseases classified elsewhere: Secondary | ICD-10-CM | POA: Insufficient documentation

## 2021-09-06 DIAGNOSIS — Z23 Encounter for immunization: Secondary | ICD-10-CM | POA: Diagnosis not present

## 2021-09-06 DIAGNOSIS — Z124 Encounter for screening for malignant neoplasm of cervix: Secondary | ICD-10-CM

## 2021-09-06 DIAGNOSIS — N760 Acute vaginitis: Secondary | ICD-10-CM

## 2021-09-06 NOTE — Progress Notes (Signed)
HPI:      Ms. Zaniyah Wernette is a 20 y.o. G1P1001 who LMP was Patient's last menstrual period was 08/19/2021., she presents today for her annual examination. The patient has no complaints today. The patient  is sexually active. Her last pap: was normal. The patient does perform self breast exams.  There is no notable family history of breast or ovarian cancer in her family.  The patient has regular exercise: yes.  The patient denies current symptoms of depression.    GYN History: Contraception:  pt was on [place] doing well until it stopped sticking as well,  She has stopped this therapy and is using condoms now.  Prior Nexplanon use adversely affected her periods.  PMHx: Past Medical History:  Diagnosis Date   COVID-19 virus infection    Gastroenteritis    Past Surgical History:  Procedure Laterality Date   CESAREAN SECTION  06/30/2020   Procedure: CESAREAN SECTION;  Surgeon: Nadara Mustard, MD;  Location: [facility] ORS;  Service: Obstetrics;;   WISDOM TOOTH EXTRACTION     Family History  Problem Relation Age of Onset   COPD Mother    Heart attack Mother    Diverticulitis Mother    Hypertension Mother    Ovarian cancer Mother    Social History   Tobacco Use   Smoking status: Former    Types: E-cigarettes   Smokeless tobacco: Never  Vaping Use   Vaping Use: Former  Substance Use Topics   Alcohol use: Not Currently   Drug use: No    Current Outpatient Medications:    XULANE 150-35 MCG/24HR transdermal patch, Place 1 patch onto the skin once a week. One patch weekly for 3 weeks, then one week off. (Patient not taking: Reported on 09/06/2021), Disp: 9 patch, Rfl: 1 Allergies: Patient has no known allergies.  Review of Systems  Constitutional:  Negative for chills, fever and malaise/fatigue.  HENT:  Negative for congestion, sinus pain and sore throat.   Eyes:  Negative for blurred vision and pain.  Respiratory:  Negative for cough and wheezing.   Cardiovascular:  Negative  for chest pain and leg swelling.  Gastrointestinal:  Negative for abdominal pain, constipation, diarrhea, heartburn, nausea and vomiting.  Genitourinary:  Negative for dysuria, frequency, hematuria and urgency.  Musculoskeletal:  Negative for back pain, joint pain, myalgias and neck pain.  Skin:  Negative for itching and rash.  Neurological:  Negative for dizziness, tremors and weakness.  Endo/Heme/Allergies:  Does not bruise/bleed easily.  Psychiatric/Behavioral:  Negative for depression. The patient is not nervous/anxious and does not have insomnia.    Objective: BP 120/80    Ht 5\' 2"  (1.575 m)    Wt 166 lb (75.3 kg)    LMP 08/19/2021    BMI 30.36 kg/m   Filed Weights   09/06/21 0953  Weight: 166 lb (75.3 kg)   Body mass index is 30.36 kg/m. Physical Exam Constitutional:      General: She is not in acute distress.    Appearance: She is well-developed.  Genitourinary:     Bladder, rectum and urethral meatus normal.     No lesions in the vagina.     Right Labia: No rash, tenderness or lesions.    Left Labia: No tenderness, lesions or rash.    No vaginal bleeding.      Right Adnexa: not tender and no mass present.    Left Adnexa: not tender and no mass present.    No cervical motion tenderness,  friability, lesion or polyp.     Uterus is not enlarged.     No uterine mass detected.    Pelvic exam was performed with patient in the lithotomy position.  Breasts:    Right: No mass, skin change or tenderness.     Left: No mass, skin change or tenderness.  HENT:     Head: Normocephalic and atraumatic. No laceration.     Right Ear: Hearing normal.     Left Ear: Hearing normal.     Mouth/Throat:     Pharynx: Uvula midline.  Eyes:     Pupils: Pupils are equal, round, and reactive to light.  Neck:     Thyroid: No thyromegaly.  Cardiovascular:     Rate and Rhythm: Normal rate and regular rhythm.     Heart sounds: No murmur heard.   No friction rub. No gallop.  Pulmonary:      Effort: Pulmonary effort is normal. No respiratory distress.     Breath sounds: Normal breath sounds. No wheezing.  Abdominal:     General: Bowel sounds are normal. There is no distension.     Palpations: Abdomen is soft.     Tenderness: There is no abdominal tenderness. There is no rebound.  Musculoskeletal:        General: Normal range of motion.     Cervical back: Normal range of motion and neck supple.  Neurological:     Mental Status: She is alert and oriented to person, place, and time.     Cranial Nerves: No cranial nerve deficit.  Skin:    General: Skin is warm and dry.  Psychiatric:        Judgment: Judgment normal.  Vitals reviewed.    Assessment:  ANNUAL EXAM 1. Women's annual routine gynecological examination   2. Need for immunization against influenza   3. Screening for cervical cancer   4. Bacterial vaginosis      Screening Plan:            1.  Cervical Screening-  Pap smear done today  2.  Labs managed by PCP  3. Counseling for contraception: condoms for now, planning pregnancy 2023 Counseled to wait at least 18 mos from prior CS timeframe   Upstream - 09/06/21 0955       Pregnancy Intention Screening   Does the patient want to become pregnant in the next year? Yes    Does the patient's partner want to become pregnant in the next year? Yes    Would the patient like to discuss contraceptive options today? No      Contraception Wrap Up   Contraception Counseling Provided No            The pregnancy intention screening data noted above was reviewed. Potential methods of contraception were discussed. The patient elected to proceed with Pregnant/Seeking Pregnancy.   4. Need for immunization against influenza - Flu Vaccine QUAD 70mo+IM (Fluarix, Fluzone & Alfiuria Quad PF)  5. Bacterial vaginosis - screen for BV or yeast as has history of these infections - Cervicovaginal ancillary only    F/U  No follow-ups on file.  Barnett Applebaum, MD,  [facility], [facility] 09/06/2021  10:23 AM

## 2021-09-07 LAB — CERVICOVAGINAL ANCILLARY ONLY
Bacterial Vaginitis (gardnerella): NEGATIVE
Candida Glabrata: NEGATIVE
Candida Vaginitis: NEGATIVE
Chlamydia: NEGATIVE
Comment: NEGATIVE
Comment: NEGATIVE
Comment: NEGATIVE
Comment: NEGATIVE
Comment: NEGATIVE
Comment: NORMAL
Neisseria Gonorrhea: NEGATIVE
Trichomonas: NEGATIVE

## 2021-09-07 LAB — CYTOLOGY - PAP: Diagnosis: NEGATIVE

## 2021-09-13 ENCOUNTER — Encounter: Payer: Self-pay | Admitting: Obstetrics and Gynecology

## 2021-10-06 ENCOUNTER — Ambulatory Visit: Payer: Medicaid Other

## 2021-11-16 ENCOUNTER — Encounter: Payer: Self-pay | Admitting: Obstetrics & Gynecology

## 2022-07-23 ENCOUNTER — Telehealth: Payer: Managed Care, Other (non HMO) | Admitting: Family

## 2022-07-23 DIAGNOSIS — B3731 Acute candidiasis of vulva and vagina: Secondary | ICD-10-CM

## 2022-07-23 MED ORDER — FLUCONAZOLE 150 MG PO TABS: 150.0000 mg | ORAL_TABLET | ORAL | 0 refills | Status: DC | PRN

## 2022-07-23 NOTE — Progress Notes (Signed)

## 2022-07-23 NOTE — Addendum Note (Signed)
Addended by: Evelina Dun A on: 07/23/2022 09:55 AM   Modules accepted: Orders

## 2022-09-22 ENCOUNTER — Other Ambulatory Visit: Payer: Self-pay | Admitting: Family Medicine

## 2022-09-22 DIAGNOSIS — R102 Pelvic and perineal pain: Secondary | ICD-10-CM

## 2022-09-25 NOTE — L&D Delivery Note (Signed)
Delivery Note  [name] is a [id number] at [redacted]w[redacted]d dated by LMP  First Stage: Labor onset: 0130 Augmentation: AROM Analgesia /Anesthesia intrapartum: Epidural AROM 0951 GBS: Negative/-- (10/30 0000)  IP Antibiotics: abx: none  Second Stage: Complete dilation at 1602 Onset of pushing at 1748 FHR second stage Baseline: 145 bpm, Variability: Good {> 6 bpm), and Decelerations: Variable: mild decels with pushing   [name] presented to L&D for labor. She was 3/70/-2. She progressed  to C/C/+2 with a spontaneous urge to push.  She pushed  effectively over approximately 36 minutes for a spontaneous vaginal birth. Delivery of a viable baby boy on@DELRECITEM (ept,110) @ by [facility] Delivery of fetal head in position: Occiput,, Anterior position with restitution to position: Left,, Occiput,, Transverse no nuchal cord;  Anterior then posterior shoulders delivered easily with gentle downward traction. Baby placed on mom's chest, and attended to by baby RN. Cord double clamped after cessation of pulsation, cut by patient    Third Stage: TXA started after delivery of infant for hemorrhage prophylaxis  Placenta delivered schultz intact with 3 VC @ 1838 Placenta disposition: To Pathology: No  Uterine tone firm with massage / heavy bleeding noted with delivery of baby at 600 cc prior to delivery of placenta, with small and large clots noted. An additional 300 noted after delivery of placenta. Oxytocin bolus started immediately after administration of TXA.  Laceration: 1st degree and vaginal laceration identified  Anesthesia for repair: procedures; anesthesia: none Repair suture type: none Est. Blood Loss (mL): 900  Complications:suspected partial abruption with delivery of infant  Mom to postpartum.  Baby to Couplet care / Skin to Skin.  Newborn: Information for the patient's newborn:  Catherine Dunn, Catherine Dunn [[id number]]  Live born female  Birth Weight:   APGAR: 8, 9  Newborn Delivery   Birth date/time: 08/04/2023  18:24:00 Delivery type: Vaginal, Spontaneous      Feeding planned: breast feeding  ---------- Chari Manning, CNM Certified Nurse Midwife [facility]

## 2022-10-06 ENCOUNTER — Ambulatory Visit
Admission: RE | Admit: 2022-10-06 | Discharge: 2022-10-06 | Disposition: A | Discharge status: Home / Self Care | Payer: Managed Care, Other (non HMO) | Source: Ambulatory Visit | Attending: Family Medicine | Admitting: Family Medicine

## 2022-10-06 DIAGNOSIS — R102 Pelvic and perineal pain: Secondary | ICD-10-CM | POA: Diagnosis present

## 2022-12-28 LAB — OB RESULTS CONSOLE VARICELLA ZOSTER ANTIBODY, IGG: Varicella: NON-IMMUNE/NOT IMMUNE

## 2022-12-28 LAB — OB RESULTS CONSOLE HEPATITIS B SURFACE ANTIGEN: Hepatitis B Surface Ag: NEGATIVE

## 2022-12-28 LAB — OB RESULTS CONSOLE RUBELLA ANTIBODY, IGM: Rubella: NON-IMMUNE/NOT IMMUNE

## 2022-12-28 LAB — OB RESULTS CONSOLE HIV ANTIBODY (ROUTINE TESTING): HIV: NONREACTIVE

## 2023-02-07 LAB — PANORAMA PRENATAL TEST FULL PANEL:PANORAMA TEST PLUS 5 ADDITIONAL MICRODELETIONS: FETAL FRACTION: 8.5

## 2023-05-16 LAB — OB RESULTS CONSOLE RPR: RPR: NONREACTIVE

## 2023-06-22 ENCOUNTER — Observation Stay
Admission: EM | Admit: 2023-06-22 | Discharge: 2023-06-23 | Disposition: A | Discharge status: Home / Self Care | Payer: No Typology Code available for payment source | Attending: Obstetrics and Gynecology | Admitting: Obstetrics and Gynecology

## 2023-06-22 ENCOUNTER — Encounter: Payer: Self-pay | Admitting: *Deleted

## 2023-06-22 ENCOUNTER — Other Ambulatory Visit: Payer: Self-pay

## 2023-06-22 DIAGNOSIS — O4193X0 Disorder of amniotic fluid and membranes, unspecified, third trimester, not applicable or unspecified: Secondary | ICD-10-CM | POA: Diagnosis not present

## 2023-06-22 DIAGNOSIS — Z3A33 33 weeks gestation of pregnancy: Secondary | ICD-10-CM | POA: Diagnosis not present

## 2023-06-22 DIAGNOSIS — Z8616 Personal history of COVID-19: Secondary | ICD-10-CM | POA: Insufficient documentation

## 2023-06-22 DIAGNOSIS — O4700 False labor before 37 completed weeks of gestation, unspecified trimester: Principal | ICD-10-CM | POA: Diagnosis present

## 2023-06-22 DIAGNOSIS — Z87891 Personal history of nicotine dependence: Secondary | ICD-10-CM | POA: Insufficient documentation

## 2023-06-22 DIAGNOSIS — Z79899 Other long term (current) drug therapy: Secondary | ICD-10-CM | POA: Diagnosis not present

## 2023-06-22 DIAGNOSIS — O4703 False labor before 37 completed weeks of gestation, third trimester: Secondary | ICD-10-CM | POA: Diagnosis present

## 2023-06-22 DIAGNOSIS — Z98891 History of uterine scar from previous surgery: Secondary | ICD-10-CM | POA: Insufficient documentation

## 2023-06-22 HISTORY — DX: Other specified health status: Z78.9

## 2023-06-22 LAB — URINALYSIS, ROUTINE W REFLEX MICROSCOPIC
Bilirubin Urine: NEGATIVE
Glucose, UA: NEGATIVE mg/dL
Hgb urine dipstick: NEGATIVE
Ketones, ur: NEGATIVE mg/dL
Nitrite: NEGATIVE
Protein, ur: NEGATIVE mg/dL
Specific Gravity, Urine: 1.005 (ref 1.005–1.030)
pH: 7 (ref 5.0–8.0)

## 2023-06-22 LAB — OB RESULTS CONSOLE GC/CHLAMYDIA
Chlamydia: NEGATIVE
Neisseria Gonorrhea: NEGATIVE

## 2023-06-22 LAB — CHLAMYDIA/NGC RT PCR (ARMC ONLY)
Chlamydia Tr: NOT DETECTED
N gonorrhoeae: NOT DETECTED

## 2023-06-22 LAB — WET PREP, GENITAL
Clue Cells Wet Prep HPF POC: NONE SEEN
Sperm: NONE SEEN
Trich, Wet Prep: NONE SEEN
WBC, Wet Prep HPF POC: >=10 — AB (ref <10)
Yeast Wet Prep HPF POC: NONE SEEN

## 2023-06-22 LAB — RUPTURE OF MEMBRANE (ROM)PLUS: Rom Plus: NEGATIVE

## 2023-06-22 LAB — FETAL FIBRONECTIN: Fetal Fibronectin: NEGATIVE

## 2023-06-22 MED ORDER — TERBUTALINE SULFATE 1 MG/ML IJ SOLN
INTRAMUSCULAR | Status: AC
  Administered 2023-06-22: 0.25 mg via SUBCUTANEOUS
  Filled 2023-06-22: qty 1

## 2023-06-22 MED ORDER — CALCIUM CARBONATE ANTACID 500 MG PO CHEW: 2.0000 | CHEWABLE_TABLET | ORAL | Status: DC | PRN

## 2023-06-22 MED ORDER — DOCUSATE SODIUM 100 MG PO CAPS: 100.0000 mg | ORAL_CAPSULE | Freq: Every day | ORAL | Status: DC

## 2023-06-22 MED ORDER — ZOLPIDEM TARTRATE 5 MG PO TABS: 5.0000 mg | ORAL_TABLET | Freq: Every evening | ORAL | Status: DC | PRN

## 2023-06-22 MED ORDER — DOXYLAMINE SUCCINATE (SLEEP) 25 MG PO TABS
25.0000 mg | ORAL_TABLET | Freq: Every day | ORAL | Status: DC
  Administered 2023-06-22: 25 mg via ORAL
  Filled 2023-06-22: qty 1

## 2023-06-22 MED ORDER — NIFEDIPINE 10 MG PO CAPS
30.0000 mg | ORAL_CAPSULE | Freq: Once | ORAL | Status: AC
  Administered 2023-06-22: 30 mg via ORAL
  Filled 2023-06-22: qty 3

## 2023-06-22 MED ORDER — LACTATED RINGERS IV SOLN: INTRAVENOUS | Status: DC

## 2023-06-22 MED ORDER — LACTATED RINGERS IV SOLN: 125.0000 mL/h | INTRAVENOUS | Status: DC

## 2023-06-22 MED ORDER — NIFEDIPINE 10 MG PO CAPS
10.0000 mg | ORAL_CAPSULE | Freq: Three times a day (TID) | ORAL | Status: DC
  Administered 2023-06-23: 10 mg via ORAL
  Filled 2023-06-22: qty 1

## 2023-06-22 MED ORDER — PRENATAL MULTIVITAMIN CH: 1.0000 | ORAL_TABLET | Freq: Every day | ORAL | Status: DC

## 2023-06-22 MED ORDER — TERBUTALINE SULFATE 1 MG/ML IJ SOLN: 0.2500 mg | INTRAMUSCULAR | Status: DC | PRN

## 2023-06-22 MED ORDER — VITAMIN B-6 25 MG PO TABS: 25.0000 mg | ORAL_TABLET | Freq: Every day | ORAL | Status: DC

## 2023-06-22 MED ORDER — LACTATED RINGERS IV BOLUS
1000.0000 mL | Freq: Once | INTRAVENOUS | Status: AC
  Administered 2023-06-22: 1000 mL via INTRAVENOUS

## 2023-06-22 MED ORDER — TERBUTALINE SULFATE 1 MG/ML IJ SOLN
0.2500 mg | Freq: Once | INTRAMUSCULAR | Status: AC
  Administered 2023-06-22: 0.25 mg via SUBCUTANEOUS
  Filled 2023-06-22: qty 1

## 2023-06-22 MED ORDER — BETAMETHASONE SOD PHOS & ACET 6 (3-3) MG/ML IJ SUSP
12.0000 mg | INTRAMUSCULAR | Status: DC
  Administered 2023-06-22: 12 mg via INTRAMUSCULAR
  Filled 2023-06-22: qty 5

## 2023-06-22 MED ORDER — ACETAMINOPHEN 325 MG PO TABS: 650.0000 mg | ORAL_TABLET | ORAL | Status: DC | PRN

## 2023-06-22 NOTE — H&P (Cosign Needed Addendum)
ANTEPARTUM ADMISSION HISTORY AND PHYSICAL NOTE   History of Present Illness: Lalaine Overstreet is a 22 y.o. G2P1001 at [redacted]w[redacted]d placed in observation for c/o leaking of amniotic fluid and uterine contractions.   Patient reports the fetal movement as active. Patient reports uterine contraction  activity as regular, every 2-5 minutes. Patient reports  vaginal bleeding as none. Patient describes fluid per vagina as Clear. Fetal presentation is cephalic.  Patient Active Problem List   Diagnosis Date Noted   Irregular bleeding 01/31/2021   Single liveborn infant, delivered by cesarean 07/02/2020    Past Medical History:  Diagnosis Date   COVID-19 virus infection    Family history of ovarian cancer    12/22 genetic testing letter sent   Gastroenteritis    Medical history non-contributory     Past Surgical History:  Procedure Laterality Date   CESAREAN SECTION  06/30/2020   Procedure: CESAREAN SECTION;  Surgeon: Nadara Mustard, MD;  Location: [facility] ORS;  Service: Obstetrics;;   WISDOM TOOTH EXTRACTION      OB History  Gravida Para Term Preterm AB Living  2 1 1     1   SAB IAB Ectopic Multiple Live Births        0 1    # Outcome Date GA Lbr Len/2nd Weight Sex Type Anes PTL Lv  2 Current           1 Term 06/30/20 [redacted]w[redacted]d  3430 g M CS-LTranv EPI, Spinal  LIV    Social History   Socioeconomic History   Marital status: Married    Spouse name: Apolinar Junes   Number of children: Not on file   Years of education: Not on file   Highest education level: Not on file  Occupational History   Not on file  Tobacco Use   Smoking status: Former    Types: E-cigarettes   Smokeless tobacco: Never  Vaping Use   Vaping status: Former  Substance and Sexual Activity   Alcohol use: Not Currently   Drug use: No   Sexual activity: Yes    Birth control/protection: Implant  Other Topics Concern   Not on file  Social History Narrative   Not on file   Social Determinants of Health   Financial Resource  Strain: Not on file  Food Insecurity: Not on file  Transportation Needs: Not on file  Physical Activity: Not on file  Stress: Not on file  Social Connections: Not on file    Family History  Problem Relation Age of Onset   COPD Mother    Heart attack Mother    Diverticulitis Mother    Hypertension Mother    Ovarian cancer Mother    Breast cancer Maternal Aunt     No Known Allergies  Medications Prior to Admission  Medication Sig Dispense Refill Last Dose   doxylamine, Sleep, (UNISOM) 25 MG tablet Take 25 mg by mouth at bedtime as needed.   06/21/2023   Prenatal Vit-Fe Fumarate-FA (MULTIVITAMIN-PRENATAL) 27-0.8 MG TABS tablet Take 1 tablet by mouth daily at 12 noon.   06/21/2023   pyridOXINE (VITAMIN B6) 25 MG tablet Take 25 mg by mouth daily.   06/21/2023   fluconazole (DIFLUCAN) 150 MG tablet Take 1 tablet (150 mg total) by mouth every three (3) days as needed. (Patient not taking: Reported on 06/22/2023) 3 tablet 0 Completed Course   XULANE 150-35 MCG/24HR transdermal patch Place 1 patch onto the skin once a week. One patch weekly for 3 weeks, then one week  off. (Patient not taking: Reported on 09/06/2021) 9 patch 1     Vitals:  BP 137/76 (BP Location: Right Arm)   Pulse (!) 105   Temp 98.1 F (36.7 C) (Oral)   Resp 19   Ht 5\' 2"  (1.575 m)   Wt 80.7 kg   LMP 11/01/2022   BMI 32.56 kg/m  Physical Examination:  General appearance: alert, cooperative and no distress Abdomen: soft, non-tender; bowel sounds normal; no masses,  no organomegaly Pelvic: cervix normal in appearance, external genitalia normal, no adnexal masses or tenderness, no bladder tenderness, no cervical motion tenderness, rectovaginal septum normal, urethra without abnormality   Extremities: extremities normal, atraumatic, no cyanosis or edema  Membranes:intact Fetal Monitoring:Baseline: 135 bpm, Variability: Good {> 6 bpm), Accelerations: Reactive, and Decelerations: Absent Tocometer: Flat    SVE:  1/80/-1   Labs:  Results for orders placed or performed during the hospital encounter of 06/22/23 (from the past 24 hour(s))  Wet prep, genital   Collection Time: 06/22/23  5:39 PM   Specimen: Vaginal  Result Value Ref Range   Yeast Wet Prep HPF POC NONE SEEN NONE SEEN   Trich, Wet Prep NONE SEEN NONE SEEN   Clue Cells Wet Prep HPF POC NONE SEEN NONE SEEN   WBC, Wet Prep HPF POC >=10 (A) <10   Sperm NONE SEEN   Chlamydia/NGC rt PCR ([facility] only)   Collection Time: 06/22/23  5:39 PM   Specimen: Vaginal  Result Value Ref Range   Specimen source GC/Chlam ENDOCERVICAL    Chlamydia Tr NOT DETECTED NOT DETECTED   N gonorrhoeae NOT DETECTED NOT DETECTED  Fetal fibronectin   Collection Time: 06/22/23  5:39 PM  Result Value Ref Range   Fetal Fibronectin NEGATIVE NEGATIVE  Rupture of Membrane (ROM) Plus   Collection Time: 06/22/23  5:39 PM  Result Value Ref Range   Rom Plus NEGATIVE   Urinalysis, Routine w reflex microscopic -Urine, Clean Catch   Collection Time: 06/22/23  5:39 PM  Result Value Ref Range   Color, Urine YELLOW (A) YELLOW   APPearance HAZY (A) CLEAR   Specific Gravity, Urine 1.005 1.005 - 1.030   pH 7.0 5.0 - 8.0   Glucose, UA NEGATIVE NEGATIVE mg/dL   Hgb urine dipstick NEGATIVE NEGATIVE   Bilirubin Urine NEGATIVE NEGATIVE   Ketones, ur NEGATIVE NEGATIVE mg/dL   Protein, ur NEGATIVE NEGATIVE mg/dL   Nitrite NEGATIVE NEGATIVE   Leukocytes,Ua SMALL (A) NEGATIVE   RBC / HPF 0-5 0 - 5 RBC/hpf   WBC, UA 0-5 0 - 5 WBC/hpf   Bacteria, UA MANY (A) NONE SEEN   Squamous Epithelial / HPF 21-50 0 - 5 /HPF   Hyaline Casts, UA PRESENT     Imaging Studies: No results found.   Assessment and Plan:  A/P: 22yo G1P0 @ 33.2 weeks with uterine contractions and dilated cervix at 1/80/-1.  ctx on monitor every 2-5 minutes with the concern for PTL with Non-threatening exam, but given the dilation at this early gestational age, will admit overnight for observation, labs, BMZ. No  mag for neuroprotection as delivery not imminent. If contractions subside and stable cervical exam, can d/c home tomorrow.   Place in Observation  1. Cervical dilation, PTL - BMZ 12mg  today at 1915  and in 24hrs  2.FFN, ROM plus, wet prep, GC/Chlamydia and UA neg  3. IV fluid bolus LR 1000 mls given  4. Terbutaline given x1  5. Reactive NST,  Continuous TOCO   6. Start  Procardia 30 mg once followed by 10-20 mg every 8 hours for contractions.       Patient Active Problem List   Diagnosis Date Noted   Irregular bleeding 01/31/2021   Single liveborn infant, delivered by cesarean 07/02/2020     Chari Manning, CNM'

## 2023-06-22 NOTE — Progress Notes (Signed)
ANTEPARTUM PROGRESS NOTE  Catherine Dunn is a 22 y.o. G2P1001 at [redacted]w[redacted]d who is admitted for preterm contractions.  Estimated Date of Delivery: 08/08/23  Length of Stay:  0 Days. Admitted 06/22/2023  Subjective: She reports:  -active fetal movement -no leakage of fluid -no vaginal bleeding -feeling discomfort with contractions  Vitals:  BP 137/76 (BP Location: Right Arm)   Pulse (!) 105   Temp 98.1 F (36.7 C) (Oral)   Resp 19   Ht 5\' 2"  (1.575 m)   Wt 80.7 kg   LMP 11/01/2022   BMI 32.56 kg/m  Physical Examination: OBGyn Exam   Tocometry: 2-5 mins SVE:1/80/-1  Results for orders placed or performed during the hospital encounter of 06/22/23 (from the past 48 hour(s))  Fetal fibronectin     Status: None   Collection Time: 06/22/23  5:39 PM  Result Value Ref Range   Fetal Fibronectin NEGATIVE NEGATIVE    Comment: Performed at [facility], [address] [place], [place] [address]  Rupture of Membrane (ROM) Plus     Status: None   Collection Time: 06/22/23  5:39 PM  Result Value Ref Range   Rom Plus NEGATIVE     Comment: Performed at [facility], [address], [place], [place] [address]  Wet prep, genital     Status: Abnormal   Collection Time: 06/22/23  5:39 PM   Specimen: Vaginal  Result Value Ref Range   Yeast Wet Prep HPF POC NONE SEEN NONE SEEN   Trich, Wet Prep NONE SEEN NONE SEEN   Clue Cells Wet Prep HPF POC NONE SEEN NONE SEEN   WBC, Wet Prep HPF POC >=10 (A) <10   Sperm NONE SEEN     Comment: Performed at [facility], [address] [place], [place] [address]  Urinalysis, Routine w reflex microscopic -Urine, Clean Catch     Status: Abnormal   Collection Time: 06/22/23  5:39 PM  Result Value Ref Range   Color, Urine YELLOW (A) YELLOW   APPearance HAZY (A) CLEAR   Specific Gravity, Urine 1.005 1.005 - 1.030   pH 7.0 5.0 - 8.0   Glucose, UA NEGATIVE NEGATIVE mg/dL   Hgb urine dipstick NEGATIVE NEGATIVE   Bilirubin Urine  NEGATIVE NEGATIVE   Ketones, ur NEGATIVE NEGATIVE mg/dL   Protein, ur NEGATIVE NEGATIVE mg/dL   Nitrite NEGATIVE NEGATIVE   Leukocytes,Ua SMALL (A) NEGATIVE   RBC / HPF 0-5 0 - 5 RBC/hpf   WBC, UA 0-5 0 - 5 WBC/hpf   Bacteria, UA MANY (A) NONE SEEN   Squamous Epithelial / HPF 21-50 0 - 5 /HPF   Hyaline Casts, UA PRESENT     Comment: Performed at [facility], [address] [place], [place] [address]  Chlamydia/NGC rt PCR [facility] only)     Status: None   Collection Time: 06/22/23  5:39 PM   Specimen: Vaginal  Result Value Ref Range   Specimen source GC/Chlam ENDOCERVICAL    Chlamydia Tr NOT DETECTED NOT DETECTED   N gonorrhoeae NOT DETECTED NOT DETECTED    Comment: (NOTE) This CT/NG assay has not been evaluated in patients with a history of  hysterectomy. Performed at [facility], [address] [place], [place] [address]     No results found.  Current scheduled medications  betamethasone acetate-betamethasone sodium phosphate  12 mg Intramuscular Q24 Hr x 2   docusate sodium  100 mg Oral Daily   doxylamine (Sleep)  25 mg Oral  QHS   [START ON 06/23/2023] NIFEdipine  10-20 mg Oral Q8H   [START ON 06/23/2023] prenatal multivitamin  1 tablet Oral Q1200   [START ON 06/23/2023] pyridOXINE  25 mg Oral Daily    I have reviewed the patient's current medications.  ASSESSMENT: Patient Active Problem List   Diagnosis Date Noted   Irregular bleeding 01/31/2021   Single liveborn infant, delivered by cesarean 07/02/2020    PLAN: Continue routine antenatal care. -continuous TOCO -IV fluids continuous -Terbutaline 0.25 mg Q4 PRN -Procardia loading dose of 30 mg, followed by 10-20 mg Q8 hours x48 hours BMZ 1st dose completed today  Chari Manning, CNM 06/22/2023 11:40 PM  Chari Manning Certified Nurse Midwife [facility]

## 2023-06-23 ENCOUNTER — Observation Stay
Admission: EM | Admit: 2023-06-23 | Discharge: 2023-06-23 | Disposition: A | Discharge status: Home / Self Care | Payer: Managed Care, Other (non HMO) | Source: Home / Self Care | Admitting: Obstetrics and Gynecology

## 2023-06-23 DIAGNOSIS — Z3A33 33 weeks gestation of pregnancy: Secondary | ICD-10-CM | POA: Insufficient documentation

## 2023-06-23 DIAGNOSIS — O4703 False labor before 37 completed weeks of gestation, third trimester: Principal | ICD-10-CM | POA: Insufficient documentation

## 2023-06-23 DIAGNOSIS — Z79899 Other long term (current) drug therapy: Secondary | ICD-10-CM | POA: Insufficient documentation

## 2023-06-23 DIAGNOSIS — O4700 False labor before 37 completed weeks of gestation, unspecified trimester: Principal | ICD-10-CM | POA: Diagnosis present

## 2023-06-23 DIAGNOSIS — Z98891 History of uterine scar from previous surgery: Secondary | ICD-10-CM | POA: Insufficient documentation

## 2023-06-23 DIAGNOSIS — Z8616 Personal history of COVID-19: Secondary | ICD-10-CM | POA: Insufficient documentation

## 2023-06-23 DIAGNOSIS — Z87891 Personal history of nicotine dependence: Secondary | ICD-10-CM | POA: Insufficient documentation

## 2023-06-23 MED ORDER — ACETAMINOPHEN 325 MG PO TABS: 650.0000 mg | ORAL_TABLET | Freq: Four times a day (QID) | ORAL | 0 refills | Status: DC | PRN

## 2023-06-23 MED ORDER — NIFEDIPINE 10 MG PO CAPS: 10.0000 mg | ORAL_CAPSULE | Freq: Three times a day (TID) | ORAL | 0 refills | Status: DC

## 2023-06-23 MED ORDER — BETAMETHASONE SOD PHOS & ACET 6 (3-3) MG/ML IJ SUSP
12.0000 mg | Freq: Once | INTRAMUSCULAR | Status: AC
  Administered 2023-06-23: 12 mg via INTRAMUSCULAR

## 2023-06-23 MED ORDER — ACETAMINOPHEN 325 MG PO TABS: 650.0000 mg | ORAL_TABLET | ORAL | Status: DC | PRN

## 2023-06-23 NOTE — OB Triage Note (Signed)
Pt discharged home. Left floor ambulatory by herself. Catherine Dunn

## 2023-06-23 NOTE — Progress Notes (Signed)
[facility] in to [facility] for NST and second betamethasone injection. Pt had reactive NST with provider reviewing strip and talking to patient.

## 2023-06-23 NOTE — Discharge Summary (Signed)
Catherine Dunn is a 22 y.o. female. She is at [redacted]w[redacted]d gestation. Patient's last menstrual period was 11/01/2022. Estimated Date of Delivery: 08/08/23  Prenatal care site:  [facility]  Current pregnancy complicated by:  - previous cesarean section - PTSD Rubella and varicella non-immune  Chief complaint: here for second dose of BMZ and NST  Was kept in observation overnight last night for preterm uterine contractions and cervix was 1/80/-2, unchanged after 15hrs of monitoring. Contractions did not space out or stop after 2 doses of Terbutaline and oral Procardia. FFN was negative. She was not feeling her contractions. After cervix was unchanged for 15hrs, decision made that she was safe for discharge. She received first dose BMZ in the hospital on 06/22/23 at 1930.   S: Resting comfortably. no CTX, no VB.no LOF,  Active fetal movement.  Denies: HA, visual changes, SOB, or RUQ/epigastric pain  Maternal Medical History:   Past Medical History:  Diagnosis Date   COVID-19 virus infection    Family history of ovarian cancer    12/22 genetic testing letter sent   Gastroenteritis    Medical history non-contributory     Past Surgical History:  Procedure Laterality Date   CESAREAN SECTION  06/30/2020   Procedure: CESAREAN SECTION;  Surgeon: Nadara Mustard, MD;  Location: [facility] ORS;  Service: Obstetrics;;   WISDOM TOOTH EXTRACTION      No Known Allergies  Prior to Admission medications   Medication Sig Start Date End Date Taking? Authorizing Provider  acetaminophen (TYLENOL) 325 MG tablet Take 2 tablets (650 mg total) by mouth every 6 (six) hours as needed for mild pain, headache or fever (for pain scale < 4  OR  temperature  >/=  100.5 F). 06/23/23   Janyce Llanos, CNM  doxylamine, Sleep, (UNISOM) 25 MG tablet Take 25 mg by mouth at bedtime as needed.    [provider]  NIFEdipine (PROCARDIA) 10 MG capsule Take 1 capsule (10 mg total) by mouth every 8 (eight) hours for 2  days. 06/23/23 06/25/23  Janyce Llanos, CNM  Prenatal Vit-Fe Fumarate-FA (MULTIVITAMIN-PRENATAL) 27-0.8 MG TABS tablet Take 1 tablet by mouth daily at 12 noon.    [provider]  pyridOXINE (VITAMIN B6) 25 MG tablet Take 25 mg by mouth daily.    [provider]      Social History: She  reports that she has quit smoking. Her smoking use included e-cigarettes. She has never used smokeless tobacco. She reports that she does not currently use alcohol. She reports that she does not use drugs.  Family History: family history includes Breast cancer in her maternal aunt; COPD in her mother; Diverticulitis in her mother; Heart attack in her mother; Hypertension in her mother; Ovarian cancer in her mother.  no history of gyn cancers  Review of Systems: A full review of systems was performed and negative except as noted in the HPI.     O:  LMP 11/01/2022  Results for orders placed or performed during the hospital encounter of 06/22/23 (from the past 48 hour(s))  Wet prep, genital   Collection Time: 06/22/23  5:39 PM   Specimen: Vaginal  Result Value Ref Range   Yeast Wet Prep HPF POC NONE SEEN NONE SEEN   Trich, Wet Prep NONE SEEN NONE SEEN   Clue Cells Wet Prep HPF POC NONE SEEN NONE SEEN   WBC, Wet Prep HPF POC >=10 (A) <10   Sperm NONE SEEN   Chlamydia/NGC rt PCR [facility]  only)   Collection Time: 06/22/23  5:39 PM   Specimen: Vaginal  Result Value Ref Range   Specimen source GC/Chlam ENDOCERVICAL    Chlamydia Tr NOT DETECTED NOT DETECTED   N gonorrhoeae NOT DETECTED NOT DETECTED  Fetal fibronectin   Collection Time: 06/22/23  5:39 PM  Result Value Ref Range   Fetal Fibronectin NEGATIVE NEGATIVE  Rupture of Membrane (ROM) Plus   Collection Time: 06/22/23  5:39 PM  Result Value Ref Range   Rom Plus NEGATIVE   Urinalysis, Routine w reflex microscopic -Urine, Clean Catch   Collection Time: 06/22/23  5:39 PM  Result Value Ref Range   Color, Urine YELLOW (A)  YELLOW   APPearance HAZY (A) CLEAR   Specific Gravity, Urine 1.005 1.005 - 1.030   pH 7.0 5.0 - 8.0   Glucose, UA NEGATIVE NEGATIVE mg/dL   Hgb urine dipstick NEGATIVE NEGATIVE   Bilirubin Urine NEGATIVE NEGATIVE   Ketones, ur NEGATIVE NEGATIVE mg/dL   Protein, ur NEGATIVE NEGATIVE mg/dL   Nitrite NEGATIVE NEGATIVE   Leukocytes,Ua SMALL (A) NEGATIVE   RBC / HPF 0-5 0 - 5 RBC/hpf   WBC, UA 0-5 0 - 5 WBC/hpf   Bacteria, UA MANY (A) NONE SEEN   Squamous Epithelial / HPF 21-50 0 - 5 /HPF   Hyaline Casts, UA PRESENT      Constitutional: NAD, AAOx3  HE/ENT: extraocular movements grossly intact, moist mucous membranes CV: RRR PULM: nl respiratory effort, CTABL     Abd: gravid, non-tender, non-distended, soft      Ext: Non-tender, Nonedematous   Psych: mood appropriate, speech normal Pelvic: deferred  Fetal  monitoring: Cat 1 Appropriate for GA Baseline: 125bpm Variability: moderate Accelerations:  present x >2 Decelerations absent Uterine contractions: q4-49min, palpate mild Time  A/P: 22 y.o. [redacted]w[redacted]d here for antenatal surveillance for preterm uterine contractions and second BMZ dosage  Principle Diagnosis:  Preterm uterine contractions  Labor: not present. Her contractions are at the same spacing they were this morning and she reports she is not feeling them. Not going to check her cervix at this time since cervical exams can stimulate labor and her cervix was unchanged after 15hrs. Continue Procardia prescription (sent to her pharmacy this morning) for 24hrs after BMZ.  Fetal Wellbeing: Reassuring Cat 1 tracing. Reactive NST  Second dose of BMZ received, BMZ complete. D/c home stable, precautions reviewed, follow-up as scheduled.    Janyce Llanos, CNM 06/23/2023 8:21 PM

## 2023-06-23 NOTE — Discharge Summary (Signed)
Patient ID: Catherine Dunn MRN: [id number] DOB/AGE: May 20, 2001 22 y.o.  Admit date: 06/22/2023 Discharge date: 06/23/2023  Admission Diagnoses: vaginal discharge, preterm uterine contractions  Discharge Diagnoses: preterm uterine contractions  Prenatal Care Site:  [facility]  Prenatal Procedures: NST, labs, prolonged fetal monitoring  Consults: none  Significant Diagnostic Studies:  Results for orders placed or performed during the hospital encounter of 06/22/23 (from the past 168 hour(s))  Wet prep, genital   Collection Time: 06/22/23  5:39 PM   Specimen: Vaginal  Result Value Ref Range   Yeast Wet Prep HPF POC NONE SEEN NONE SEEN   Trich, Wet Prep NONE SEEN NONE SEEN   Clue Cells Wet Prep HPF POC NONE SEEN NONE SEEN   WBC, Wet Prep HPF POC >=10 (A) <10   Sperm NONE SEEN   Chlamydia/NGC rt PCR ([facility] only)   Collection Time: 06/22/23  5:39 PM   Specimen: Vaginal  Result Value Ref Range   Specimen source GC/Chlam ENDOCERVICAL    Chlamydia Tr NOT DETECTED NOT DETECTED   N gonorrhoeae NOT DETECTED NOT DETECTED  Fetal fibronectin   Collection Time: 06/22/23  5:39 PM  Result Value Ref Range   Fetal Fibronectin NEGATIVE NEGATIVE  Rupture of Membrane (ROM) Plus   Collection Time: 06/22/23  5:39 PM  Result Value Ref Range   Rom Plus NEGATIVE   Urinalysis, Routine w reflex microscopic -Urine, Clean Catch   Collection Time: 06/22/23  5:39 PM  Result Value Ref Range   Color, Urine YELLOW (A) YELLOW   APPearance HAZY (A) CLEAR   Specific Gravity, Urine 1.005 1.005 - 1.030   pH 7.0 5.0 - 8.0   Glucose, UA NEGATIVE NEGATIVE mg/dL   Hgb urine dipstick NEGATIVE NEGATIVE   Bilirubin Urine NEGATIVE NEGATIVE   Ketones, ur NEGATIVE NEGATIVE mg/dL   Protein, ur NEGATIVE NEGATIVE mg/dL   Nitrite NEGATIVE NEGATIVE   Leukocytes,Ua SMALL (A) NEGATIVE   RBC / HPF 0-5 0 - 5 RBC/hpf   WBC, UA 0-5 0 - 5 WBC/hpf   Bacteria, UA MANY (A) NONE SEEN   Squamous Epithelial / HPF 21-50 0 - 5 /HPF    Hyaline Casts, UA PRESENT     Treatments: IV hydration and terbutaline and Procardia  Hospital Course:  This is a 22 y.o. G2P1001 with IUP at [redacted]w[redacted]d admitted for preterm uterine contractions that were noted on the monitor, noted to have a cervical exam of 1/80/-1.  No leaking of fluid and no bleeding.  She received the first dose of Betamethasone 06/22/23 around 1930. She received two doses of terbutaline. Her tocolysis was transitioned to Procardia.  She was observed, fetal heart rate monitoring remained reassuring, and she had no signs/symptoms of progressing preterm labor or other maternal-fetal concerns.  Her cervical exam was unchanged from admission.  She was deemed stable for discharge to home with outpatient follow up. She will follow up at 1930 tonight for her second dose of betamethasone. She will take Procardia 10mg  q8hrs until 24hrs after second betamethasone dose and return with any concerns or contractions. At the time of discharge she is not feeling uterine contractions.   Discharge Physical Exam:   BP 128/73 (BP Location: Right Arm)   Pulse (!) 112   Temp 98.2 F (36.8 C) (Oral)   Resp 16   Ht 5\' 2"  (1.575 m)   Wt 80.7 kg   LMP 11/01/2022   BMI 32.56 kg/m   General: NAD CV: RRR Pulm: CTABL, nl effort ABD: s/nd/nt, gravid  DVT Evaluation: LE non-ttp, no evidence of DVT on exam.  SVE: Dilation: 1 Effacement (%): 80 Station: -2 Presentation: Vertex Exam by:: Wilsoon, CNM Fetal monitoring - NST: Baseline: 125 bpm, Variability: Good {> 6 bpm), Accelerations: Reactive, and Decelerations: Absent  TOCO: regular, every 5 minutes; mild to palpation    Discharge Condition: Stable  Disposition: Discharge disposition: 01-Home or Self Care        Allergies as of 06/23/2023   No Known Allergies      Medication List     STOP taking these medications    fluconazole 150 MG tablet Commonly known as: DIFLUCAN   Xulane 150-35 MCG/24HR transdermal patch Generic  drug: norelgestromin-ethinyl estradiol       TAKE these medications    acetaminophen 325 MG tablet Commonly known as: TYLENOL Take 2 tablets (650 mg total) by mouth every 6 (six) hours as needed for mild pain, headache or fever (for pain scale < 4  OR  temperature  >/=  100.5 F).   doxylamine (Sleep) 25 MG tablet Commonly known as: UNISOM Take 25 mg by mouth at bedtime as needed.   multivitamin-prenatal 27-0.8 MG Tabs tablet Take 1 tablet by mouth daily at 12 noon.   NIFEdipine 10 MG capsule Commonly known as: PROCARDIA Take 1 capsule (10 mg total) by mouth every 8 (eight) hours for 2 days.   pyridOXINE 25 MG tablet Commonly known as: VITAMIN B6 Take 25 mg by mouth daily.        Follow-up Information     [facility] Follow up.   Specialty: Obstetrics and Gynecology Why: Return at 7:30pm for second betamethasone shot Contact information: [address] [place] [address] [phone]                Signed:  Janyce Llanos 06/23/2023 10:53 AM ----- Donato Schultz, CNM Certified Nurse Midwife [facility]

## 2023-07-25 LAB — OB RESULTS CONSOLE GBS: GBS: NEGATIVE

## 2023-08-04 ENCOUNTER — Other Ambulatory Visit: Payer: Self-pay

## 2023-08-04 ENCOUNTER — Inpatient Hospital Stay
Admission: EM | Admit: 2023-08-04 | Discharge: 2023-08-05 | DRG: 807 | Disposition: A | Discharge status: Home / Self Care | Payer: No Typology Code available for payment source | Attending: Obstetrics | Admitting: Obstetrics

## 2023-08-04 ENCOUNTER — Inpatient Hospital Stay: Payer: No Typology Code available for payment source | Admitting: Anesthesiology

## 2023-08-04 ENCOUNTER — Inpatient Hospital Stay: Payer: Managed Care, Other (non HMO) | Admitting: Anesthesiology

## 2023-08-04 ENCOUNTER — Encounter: Payer: Self-pay | Admitting: Anesthesiology

## 2023-08-04 ENCOUNTER — Encounter: Payer: Self-pay | Admitting: Obstetrics and Gynecology

## 2023-08-04 DIAGNOSIS — Z3A39 39 weeks gestation of pregnancy: Secondary | ICD-10-CM | POA: Diagnosis not present

## 2023-08-04 DIAGNOSIS — Z825 Family history of asthma and other chronic lower respiratory diseases: Secondary | ICD-10-CM | POA: Diagnosis not present

## 2023-08-04 DIAGNOSIS — Z87891 Personal history of nicotine dependence: Secondary | ICD-10-CM | POA: Diagnosis not present

## 2023-08-04 DIAGNOSIS — Z8041 Family history of malignant neoplasm of ovary: Secondary | ICD-10-CM

## 2023-08-04 DIAGNOSIS — Z8616 Personal history of COVID-19: Secondary | ICD-10-CM

## 2023-08-04 DIAGNOSIS — O479 False labor, unspecified: Principal | ICD-10-CM | POA: Diagnosis present

## 2023-08-04 DIAGNOSIS — O26893 Other specified pregnancy related conditions, third trimester: Secondary | ICD-10-CM | POA: Diagnosis present

## 2023-08-04 DIAGNOSIS — Z8249 Family history of ischemic heart disease and other diseases of the circulatory system: Secondary | ICD-10-CM | POA: Diagnosis not present

## 2023-08-04 DIAGNOSIS — O34219 Maternal care for unspecified type scar from previous cesarean delivery: Secondary | ICD-10-CM | POA: Diagnosis present

## 2023-08-04 LAB — CBC
HCT: 35.8 % — ABNORMAL LOW (ref 36.0–46.0)
Hemoglobin: 12.2 g/dL (ref 12.0–15.0)
MCH: 30.6 pg (ref 26.0–34.0)
MCHC: 34.1 g/dL (ref 30.0–36.0)
MCV: 89.7 fL (ref 80.0–100.0)
Platelets: 145 10*3/uL — ABNORMAL LOW (ref 150–400)
RBC: 3.99 MIL/uL (ref 3.87–5.11)
RDW: 14.2 % (ref 11.5–15.5)
WBC: 9.2 10*3/uL (ref 4.0–10.5)
nRBC: 0 % (ref 0.0–0.2)

## 2023-08-04 LAB — TYPE AND SCREEN
ABO/RH(D): AB POS
Antibody Screen: NEGATIVE

## 2023-08-04 MED ORDER — LACTATED RINGERS IV SOLN: 500.0000 mL | INTRAVENOUS | Status: DC | PRN

## 2023-08-04 MED ORDER — SIMETHICONE 80 MG PO CHEW: 80.0000 mg | CHEWABLE_TABLET | ORAL | Status: DC | PRN

## 2023-08-04 MED ORDER — ACETAMINOPHEN 325 MG PO TABS
650.0000 mg | ORAL_TABLET | ORAL | Status: DC | PRN
  Administered 2023-08-04: 650 mg via ORAL
  Filled 2023-08-04: qty 2

## 2023-08-04 MED ORDER — SENNOSIDES-DOCUSATE SODIUM 8.6-50 MG PO TABS
2.0000 | ORAL_TABLET | ORAL | Status: DC
  Administered 2023-08-04: 2 via ORAL
  Filled 2023-08-04: qty 2

## 2023-08-04 MED ORDER — SODIUM CHLORIDE 0.9% FLUSH: 3.0000 mL | INTRAVENOUS | Status: DC | PRN

## 2023-08-04 MED ORDER — ACETAMINOPHEN 325 MG PO TABS
650.0000 mg | ORAL_TABLET | ORAL | Status: DC | PRN
  Administered 2023-08-04 – 2023-08-05 (×2): 650 mg via ORAL
  Filled 2023-08-04 (×2): qty 2

## 2023-08-04 MED ORDER — TRANEXAMIC ACID 1000 MG/10ML IV SOLN
1000.0000 mg | Freq: Once | INTRAVENOUS | Status: AC
  Administered 2023-08-04: 1000 mg via INTRAVENOUS
  Filled 2023-08-04: qty 10

## 2023-08-04 MED ORDER — PHENYLEPHRINE 80 MCG/ML (10ML) SYRINGE FOR IV PUSH (FOR BLOOD PRESSURE SUPPORT): 80.0000 ug | PREFILLED_SYRINGE | INTRAVENOUS | Status: DC | PRN

## 2023-08-04 MED ORDER — OXYTOCIN 10 UNIT/ML IJ SOLN
INTRAMUSCULAR | Status: AC
  Filled 2023-08-04: qty 2

## 2023-08-04 MED ORDER — FLEET ENEMA RE ENEM: 1.0000 | ENEMA | Freq: Every day | RECTAL | Status: DC | PRN

## 2023-08-04 MED ORDER — SODIUM CHLORIDE 0.9% FLUSH: 3.0000 mL | Freq: Two times a day (BID) | INTRAVENOUS | Status: DC

## 2023-08-04 MED ORDER — ONDANSETRON HCL 4 MG PO TABS: 4.0000 mg | ORAL_TABLET | ORAL | Status: DC | PRN

## 2023-08-04 MED ORDER — COCONUT OIL OIL
1.0000 | TOPICAL_OIL | Status: DC | PRN
  Administered 2023-08-05: 1 via TOPICAL
  Filled 2023-08-04: qty 15

## 2023-08-04 MED ORDER — OXYCODONE HCL 5 MG PO TABS: 5.0000 mg | ORAL_TABLET | ORAL | Status: DC | PRN

## 2023-08-04 MED ORDER — WITCH HAZEL-GLYCERIN EX PADS
1.0000 | MEDICATED_PAD | CUTANEOUS | Status: DC | PRN
  Administered 2023-08-05: 1 via TOPICAL
  Filled 2023-08-04: qty 100

## 2023-08-04 MED ORDER — IBUPROFEN 600 MG PO TABS
600.0000 mg | ORAL_TABLET | Freq: Four times a day (QID) | ORAL | Status: DC
  Administered 2023-08-04 – 2023-08-05 (×4): 600 mg via ORAL
  Filled 2023-08-04 (×4): qty 1

## 2023-08-04 MED ORDER — MEASLES, MUMPS & RUBELLA VAC IJ SOLR
0.5000 mL | Freq: Once | INTRAMUSCULAR | Status: DC
  Filled 2023-08-04 (×2): qty 0.5

## 2023-08-04 MED ORDER — BISACODYL 10 MG RE SUPP: 10.0000 mg | Freq: Every day | RECTAL | Status: DC | PRN

## 2023-08-04 MED ORDER — ONDANSETRON HCL 4 MG/2ML IJ SOLN
4.0000 mg | Freq: Four times a day (QID) | INTRAMUSCULAR | Status: DC | PRN
  Administered 2023-08-04 (×2): 4 mg via INTRAVENOUS
  Filled 2023-08-04 (×2): qty 2

## 2023-08-04 MED ORDER — MISOPROSTOL 200 MCG PO TABS
ORAL_TABLET | ORAL | Status: AC
  Filled 2023-08-04: qty 4

## 2023-08-04 MED ORDER — SOD CITRATE-CITRIC ACID 500-334 MG/5ML PO SOLN: 30.0000 mL | ORAL | Status: DC | PRN

## 2023-08-04 MED ORDER — LIDOCAINE HCL (PF) 1 % IJ SOLN
INTRAMUSCULAR | Status: AC
  Filled 2023-08-04: qty 30

## 2023-08-04 MED ORDER — DIBUCAINE (PERIANAL) 1 % EX OINT
1.0000 | TOPICAL_OINTMENT | CUTANEOUS | Status: DC | PRN
  Administered 2023-08-05: 1 via RECTAL
  Filled 2023-08-04: qty 28

## 2023-08-04 MED ORDER — ZOLPIDEM TARTRATE 5 MG PO TABS: 5.0000 mg | ORAL_TABLET | Freq: Every evening | ORAL | Status: DC | PRN

## 2023-08-04 MED ORDER — LIDOCAINE HCL (PF) 1 % IJ SOLN: 30.0000 mL | INTRAMUSCULAR | Status: DC | PRN

## 2023-08-04 MED ORDER — OXYTOCIN BOLUS FROM INFUSION
333.0000 mL | Freq: Once | INTRAVENOUS | Status: AC
  Administered 2023-08-04: 333 mL via INTRAVENOUS

## 2023-08-04 MED ORDER — EPHEDRINE 5 MG/ML INJ: 10.0000 mg | INTRAVENOUS | Status: DC | PRN

## 2023-08-04 MED ORDER — METHYLERGONOVINE MALEATE 0.2 MG/ML IJ SOLN
INTRAMUSCULAR | Status: AC
  Filled 2023-08-04: qty 1

## 2023-08-04 MED ORDER — DIPHENHYDRAMINE HCL 25 MG PO CAPS: 25.0000 mg | ORAL_CAPSULE | Freq: Four times a day (QID) | ORAL | Status: DC | PRN

## 2023-08-04 MED ORDER — TETANUS-DIPHTH-ACELL PERTUSSIS 5-2.5-18.5 LF-MCG/0.5 IM SUSY
0.5000 mL | PREFILLED_SYRINGE | Freq: Once | INTRAMUSCULAR | Status: DC
Start: 2023-08-05 — End: 2023-08-05

## 2023-08-04 MED ORDER — TRANEXAMIC ACID-NACL 1000-0.7 MG/100ML-% IV SOLN
INTRAVENOUS | Status: AC
  Filled 2023-08-04: qty 100

## 2023-08-04 MED ORDER — ONDANSETRON HCL 4 MG/2ML IJ SOLN: 4.0000 mg | INTRAMUSCULAR | Status: DC | PRN

## 2023-08-04 MED ORDER — AMMONIA AROMATIC IN INHA
RESPIRATORY_TRACT | Status: AC
  Filled 2023-08-04: qty 10

## 2023-08-04 MED ORDER — BENZOCAINE-MENTHOL 20-0.5 % EX AERO
1.0000 | INHALATION_SPRAY | CUTANEOUS | Status: DC | PRN
  Administered 2023-08-05: 1 via TOPICAL
  Filled 2023-08-04: qty 56

## 2023-08-04 MED ORDER — FENTANYL-BUPIVACAINE-NACL 0.5-0.125-0.9 MG/250ML-% EP SOLN
12.0000 mL/h | EPIDURAL | Status: DC | PRN
  Filled 2023-08-04: qty 250

## 2023-08-04 MED ORDER — DIPHENHYDRAMINE HCL 50 MG/ML IJ SOLN: 12.5000 mg | INTRAMUSCULAR | Status: DC | PRN

## 2023-08-04 MED ORDER — LACTATED RINGERS IV SOLN
500.0000 mL | Freq: Once | INTRAVENOUS | Status: AC
  Administered 2023-08-04: 500 mL via INTRAVENOUS

## 2023-08-04 MED ORDER — FENTANYL CITRATE (PF) 100 MCG/2ML IJ SOLN
50.0000 ug | INTRAMUSCULAR | Status: DC | PRN
  Administered 2023-08-04: 100 ug via INTRAVENOUS
  Filled 2023-08-04: qty 2

## 2023-08-04 MED ORDER — LACTATED RINGERS IV SOLN: INTRAVENOUS | Status: DC

## 2023-08-04 MED ORDER — MEDROXYPROGESTERONE ACETATE 150 MG/ML IM SUSP
150.0000 mg | Freq: Once | INTRAMUSCULAR | Status: AC
  Administered 2023-08-05: 150 mg via INTRAMUSCULAR
  Filled 2023-08-04: qty 1

## 2023-08-04 MED ORDER — SODIUM CHLORIDE 0.9 % IV SOLN: 250.0000 mL | INTRAVENOUS | Status: DC | PRN

## 2023-08-04 MED ORDER — PRENATAL MULTIVITAMIN CH
1.0000 | ORAL_TABLET | Freq: Every day | ORAL | Status: DC
  Administered 2023-08-05: 1 via ORAL
  Filled 2023-08-04: qty 1

## 2023-08-04 MED ORDER — OXYTOCIN-SODIUM CHLORIDE 30-0.9 UT/500ML-% IV SOLN
2.5000 [IU]/h | INTRAVENOUS | Status: DC
  Administered 2023-08-04: 2.5 [IU]/h via INTRAVENOUS
  Filled 2023-08-04: qty 500

## 2023-08-04 MED ORDER — TRANEXAMIC ACID-NACL 1000-0.7 MG/100ML-% IV SOLN: 1000.0000 mg | Freq: Once | INTRAVENOUS | Status: DC

## 2023-08-04 NOTE — Progress Notes (Signed)
To LDR 4

## 2023-08-04 NOTE — Anesthesia Preprocedure Evaluation (Deleted)
Anesthesia Evaluation  Patient identified by MRN, date of birth, ID band Patient awake    Reviewed: Allergy & Precautions, H&P , NPO status , Patient's Chart, lab work & pertinent test results  Airway        Dental   Pulmonary neg pulmonary ROS, former smoker   breath sounds clear to auscultation       Cardiovascular negative cardio ROS  Rhythm:Regular Rate:Normal     Neuro/Psych negative neurological ROS  negative psych ROS   GI/Hepatic negative GI ROS, Neg liver ROS,,,  Endo/Other  negative endocrine ROS    Renal/GU negative Renal ROS  negative genitourinary   Musculoskeletal negative musculoskeletal ROS (+)    Abdominal   Peds negative pediatric ROS (+)  Hematology negative hematology ROS (+)   Anesthesia Other Findings   Reproductive/Obstetrics negative OB ROS                             Anesthesia Physical Anesthesia Plan  ASA: 2  Anesthesia Plan: Combined Spinal and Epidural and Epidural   Post-op Pain Management:    Induction:   PONV Risk Score and Plan:   Airway Management Planned: Natural Airway  Additional Equipment:   Intra-op Plan:   Post-operative Plan:   Informed Consent:   Plan Discussed with:   Anesthesia Plan Comments:        Anesthesia Quick Evaluation

## 2023-08-04 NOTE — H&P (Addendum)
OB History & Physical   History of Present Illness:  Chief Complaint:   HPI:  Catherine Dunn is a 22 y.o. G71P1001 female at [redacted]w[redacted]d dated by LMP. She reports uterine contractions that started this morning around 0130 that have become progressively stronger and closer together. She denies leaking fluid and reports good fetal movement.  Pregnancy Issues: 1. Previous cesarean section for failure to progress @ 7cm and cephalo-pelvic disproportion, desiring TOLAC 2. Records unavailable from [facility], records requested   Maternal Medical History:   Past Medical History:  Diagnosis Date   COVID-19 virus infection    Family history of ovarian cancer    12/22 genetic testing letter sent   Gastroenteritis    Medical history non-contributory     Past Surgical History:  Procedure Laterality Date   CESAREAN SECTION  06/30/2020   Procedure: CESAREAN SECTION;  Surgeon: Nadara Mustard, MD;  Location: [facility] ORS;  Service: Obstetrics;;   WISDOM TOOTH EXTRACTION      No Known Allergies  Prior to Admission medications   Medication Sig Start Date End Date Taking? Authorizing Provider  acetaminophen (TYLENOL) 325 MG tablet Take 2 tablets (650 mg total) by mouth every 6 (six) hours as needed for mild pain, headache or fever (for pain scale < 4  OR  temperature  >/=  100.5 F). 06/23/23   Janyce Llanos, CNM  doxylamine, Sleep, (UNISOM) 25 MG tablet Take 25 mg by mouth at bedtime as needed.    [provider]  NIFEdipine (PROCARDIA) 10 MG capsule Take 1 capsule (10 mg total) by mouth every 8 (eight) hours for 2 days. 06/23/23 06/25/23  Janyce Llanos, CNM  Prenatal Vit-Fe Fumarate-FA (MULTIVITAMIN-PRENATAL) 27-0.8 MG TABS tablet Take 1 tablet by mouth daily at 12 noon.    [provider]  pyridOXINE (VITAMIN B6) 25 MG tablet Take 25 mg by mouth daily.    [provider]    Prenatal care site: [facility]  Social History: She  reports that she has quit smoking.  Her smoking use included e-cigarettes. She has never used smokeless tobacco. She reports that she does not currently use alcohol. She reports that she does not use drugs.  Family History: family history includes Breast cancer in her maternal aunt; COPD in her mother; Diverticulitis in her mother; Heart attack in her mother; Hypertension in her mother; Ovarian cancer in her mother.   Review of Systems: A full review of systems was performed and negative except as noted in the HPI.     Physical Exam:  Vital Signs: Temp 98.9 F (37.2 C)   Resp 20   Ht 5\' 2"  (1.575 m)   Wt 85.3 kg   LMP 11/01/2022   BMI 34.39 kg/m  General: no acute distress.  HEENT: normocephalic, atraumatic Heart: regular rate & rhythm.  No murmurs/rubs/gallops Lungs: clear to auscultation bilaterally, normal respiratory effort Abdomen: soft, gravid, non-tender;  EFW: 7lb Pelvic:   External: Normal external female genitalia  Cervix: Dilation: 3 / Effacement (%): 70 / Station: -2    Extremities: non-tender, symmetric, mild edema bilaterally.  DTRs: +2  Neurologic: Alert & oriented x 3.    Results for orders placed or performed during the hospital encounter of 08/04/23 (from the past 24 hour(s))  CBC     Status: Abnormal   Collection Time: 08/04/23  6:05 AM  Result Value Ref Range   WBC 9.2 4.0 - 10.5 K/uL   RBC 3.99 3.87 - 5.11 MIL/uL  Hemoglobin 12.2 12.0 - 15.0 g/dL   HCT 60.4 (L) 54.0 - 98.1 %   MCV 89.7 80.0 - 100.0 fL   MCH 30.6 26.0 - 34.0 pg   MCHC 34.1 30.0 - 36.0 g/dL   RDW 19.1 47.8 - 29.5 %   Platelets 145 (L) 150 - 400 K/uL   nRBC 0.0 0.0 - 0.2 %  Type and screen [facility]     Status: None   Collection Time: 08/04/23  6:05 AM  Result Value Ref Range   ABO/RH(D) AB POS    Antibody Screen NEG    Sample Expiration      08/07/2023,2359 Performed at [facility], [address] [place], [place] [address]     Pertinent Results:  Prenatal Labs: Blood type/Rh  AB positive  Antibody screen neg  Rubella Unknown, records requested  Varicella Unknown, records requested    RPR pending  HBsAg Unknown, records requested    HIV Unknown, records requested    GC Negative 07/25/23   Chlamydia Negative, 07/25/23   Genetic screening Unknown, records requested    1 hour GTT Unknown, records requested    3 hour GTT   GBS Negative, 07/25/23   FHT: 135bpm, moderate variability, accelerations present, no decelerations TOCO: contractions q2-99min SVE:  Dilation: 3 / Effacement (%): 70 / Station: -2    Cephalic by leopolds  No results found.  Assessment:  Catherine Dunn is a 22 y.o. G32P1001 female at [redacted]w[redacted]d with TOLAC, uterine contractions.   Plan:  1. Admit to Labor & Delivery; consents reviewed and obtained - Dr. Karie Schwalbe Schermerhorn notified of admission and TOLAC  2. Fetal Well being  - Fetal Tracing: Category I tracing - Group B Streptococcus ppx indicated: n/a, GBS negative - Presentation: vertex confirmed by SVE   3. Routine OB: - Prenatal labs reviewed, as above - Rh positive - CBC, T&S, RPR on admit - Clear fluids, saline lock - P[facility]atal records requested from [facility]  4. Monitoring of Labor  -  Contractions q2-44min, external toco in place -  Pelvis adequate for trial of labor -  Plan for augmentation with AROM and/or pitocin as needed -  Plan for continuous fetal monitoring  -  Maternal pain control as desired; requesting regional anesthesia - Anticipate vaginal delivery  5. Post Partum Planning: - Infant feeding: breastfeeding - Contraception: depo  Janyce Llanos, CNM 08/04/23 7:11 AM

## 2023-08-04 NOTE — Anesthesia Procedure Notes (Signed)
Epidural Patient location during procedure: OB Start time: 08/04/2023 7:50 AM End time: 08/04/2023 8:24 AM  Staffing Anesthesiologist: Marisue Humble, MD Performed: anesthesiologist   Preanesthetic Checklist Completed: patient identified, IV checked, risks and benefits discussed, surgical consent, monitors and equipment checked and timeout performed  Epidural Patient position: sitting Prep: ChloraPrep Patient monitoring: heart rate, continuous pulse ox and blood pressure Approach: midline Location: L3-L4 Injection technique: LOR saline  Needle:  Needle type: Tuohy  Needle gauge: 17 G Needle length: 9 cm Needle insertion depth: 7 cm Catheter type: closed end flexible Catheter size: 20 Guage Catheter at skin depth: 13 cm Test dose: negative  Assessment Sensory level: T10  Additional Notes Notified [id number], in room 0750, skin local anesthetic 0802, epidural placed with tuohy needle, easy placement of epidural catheter, test dose lidocaine 1.5% w/epinephrine, 3 ml, negative at 0806, patient heart rate 98, unchanged, bolus dose 0.25% bupivacaine plain, 3 ml, bupivacaine/fentanyl drip started  at 0823 (bag from pharmacy)  Reason for block:procedure for pain

## 2023-08-04 NOTE — Discharge Summary (Signed)
Postpartum Discharge Summary  Patient Name: Catherine Dunn DOB: 2001-05-10 MRN: [id number]  Date of admission: 08/04/2023 Delivery date:08/04/2023 Delivering provider: Chari Manning Date of discharge: 08/04/2023  Primary OB:  [facility] WUJ:[id number] last menstrual period was 11/01/2022. EDC Estimated Date of Delivery: 08/08/23 Gestational Age at Delivery: [redacted]w[redacted]d   Admitting diagnosis: Uterine contractions [O47.9] Intrauterine pregnancy: [redacted]w[redacted]d     Secondary diagnosis:   Principal Problem:   Uterine contractions   Discharge Diagnosis: Term Pregnancy Delivered and [facility]      Hospital course: Onset of Labor With Vaginal Delivery      22 y.o. yo [id number] at [redacted]w[redacted]d was admitted in Latent Labor on 08/04/2023. Labor course was uncomplicated   Membrane Rupture Time/Date: 9:51 AM,08/04/2023  Delivery Method:Vaginal, Spontaneous Operative Delivery:N/A Episiotomy: None Lacerations:  None Patient had a postpartum course complicated by ***.  She is ambulating, tolerating a regular diet, passing flatus, and urinating well. Patient is discharged home in stable condition on 08/04/23.  Newborn Data: Birth date:08/04/2023 Birth time:6:24 PM Gender:Female Living status:Living Apgars:8 ,9  Weight:                                             Post partum procedures:{Postpartum procedures:23558} Augmentation:: AROM Complications: None Delivery Type: vaginal birth after cesarean (VBAC) Anesthesia: epidural anesthesia Placenta: spontaneous To Pathology: No   Prenatal Labs:   Blood type/Rh AB positive  Antibody screen neg  Rubella Non immune  Varicella Non immune   RPR NR  HBsAg Neg  HIV NR   GC Negative 07/25/23    Chlamydia Negative, 07/25/23    Genetic screening Unknown, records requested    1 hour GTT 152  3 hour GTT  90, 165,180,73  GBS Negative, 07/25/23     Magnesium Sulfate received: No BMZ received: No Rhophylac:was not indicated MMR: {ACTIONS; WAS GIVEN/WAS NOT  INDICATED:16401} Varivax vaccine given: {ACTIONS; WAS GIVEN/WAS NOT INDICATED:16401::"was not indicated"} T-DaP:Given prenatally Flu: Given prenatally RSV: Given prenatally 06/27/23 Transfusion:{Transfusion received:30440034}  Physical exam  Vitals:   08/04/23 1855 08/04/23 1903 08/04/23 1905 08/04/23 1910  BP:  126/72    Pulse:  (!) 109    Resp:      Temp:      TempSrc:      SpO2: 99% 99% 99% 99%  Weight:      Height:       General: {Exam; general:21111117} Lochia: {Desc; appropriate/inappropriate:30686::"appropriate"} Uterine Fundus: {Desc; firm/soft:30687} Perineum:***minimal edema/{OB Perineal assessment:24215} DVT Evaluation: {Exam; dvt:2111122}  Labs: Lab Results  Component Value Date   WBC 9.2 08/04/2023   HGB 12.2 08/04/2023   HCT 35.8 (L) 08/04/2023   MCV 89.7 08/04/2023   PLT 145 (L) 08/04/2023      Latest Ref Rng & Units 06/30/2020    1:00 AM  CMP  Glucose 70 - 99 mg/dL 86   BUN 6 - 20 mg/dL <5   Creatinine 0.86 - 1.00 mg/dL 5.78   Sodium 469 - 629 mmol/L 138   Potassium 3.5 - 5.1 mmol/L 3.9   Chloride 98 - 111 mmol/L 103   CO2 22 - 32 mmol/L 24   Calcium 8.9 - 10.3 mg/dL 8.8   Total Protein 6.5 - 8.1 g/dL 6.4   Total Bilirubin 0.3 - 1.2 mg/dL 0.5   Alkaline Phos 38 - 126 U/L 143   AST 15 - 41 U/L 16   ALT 0 - 44  U/L 9    Edinburgh Score:    08/12/2020    2:00 PM  Edinburgh Postnatal Depression Scale Screening Tool  I have been able to laugh and see the funny side of things. 0  I have looked forward with enjoyment to things. 0  I have blamed myself unnecessarily when things went wrong. 1  I have been anxious or worried for no good reason. 0  I have felt scared or panicky for no good reason. 0  Things have been getting on top of me. 0  I have been so unhappy that I have had difficulty sleeping. 0  I have felt sad or miserable. 1  I have been so unhappy that I have been crying. 0  The thought of harming myself has occurred to me. 0  Edinburgh  Postnatal Depression Scale Total 2    Risk assessment for postpartum VTE and prophylactic treatment: Very high risk factors: None High risk factors: None Moderate risk factors: BMI 30-40 kg/m2  Postpartum VTE prophylaxis with LMWH not indicated  After visit meds:  Allergies as of 08/04/2023   No Known Allergies   Med Rec must be completed prior to using this [facility]***      Discharge home in stable condition Infant Feeding: {Baby feeding:23562} Infant Disposition:{CHL IP OB HOME WITH WUJWJX:91478} Discharge instruction: per After Visit Summary and Postpartum booklet. Activity: Advance as tolerated. Pelvic rest for 6 weeks.  Diet: routine diet Anticipated Birth Control: Depo Postpartum Appointment:6 weeks Additional Postpartum F/U:  none Future Appointments:No future appointments. Follow up Visit:  Plan:  Catherine Dunn was discharged to home in good condition. Follow-up appointment as directed.    Signed: *** Hit refresh and delete this line

## 2023-08-04 NOTE — Progress Notes (Signed)
Patient is complete and feeling pressure with contractions, now placed in high fowlers position.  Chari Manning CNM

## 2023-08-04 NOTE — Progress Notes (Signed)
L&D Note    Subjective:  Requesting nausea medication, reports feeling more comfortable with epidural  Objective:   Vitals:   08/04/23 0451 08/04/23 0713  BP:  134/75  Pulse:  89  Resp: 20 18  Temp: 98.9 F (37.2 C) 98 F (36.7 C)  TempSrc:  Oral  Weight: 85.3 kg   Height: 5\' 2"  (1.575 m)     Current Vital Signs 24h Vital Sign Ranges  T 98 F (36.7 C) Temp  Avg: 98.5 F (36.9 C)  Min: 98 F (36.7 C)  Max: 98.9 F (37.2 C)  BP 134/75 BP  Min: 134/75  Max: 134/75  HR 89 Pulse  Avg: 89  Min: 89  Max: 89  RR 18 Resp  Avg: 19  Min: 18  Max: 20  SaO2     No data recorded      Gen: alert, cooperative, no distress FHR: Baseline: 140 bpm, Variability: moderate, Accels: Present, Decels: none Toco: regular, every 3-4 minutes SVE: Dilation: 4 Effacement (%): 90 Station: -1 Presentation: Vertex Exam by:: Rubye Oaks, CNM  Medications SCHEDULED MEDICATIONS   ammonia       lidocaine (PF)       misoprostol       oxytocin       oxytocin 40 units in LR 1000 mL  333 mL Intravenous Once    MEDICATION INFUSIONS   fentaNYL 2 mcg/mL w/bupivacaine 0.125% in NS 250 mL     lactated ringers     lactated ringers 125 mL/hr at 08/04/23 0810   oxytocin      PRN MEDICATIONS  acetaminophen, ammonia, diphenhydrAMINE, ePHEDrine, ePHEDrine, fentaNYL (SUBLIMAZE) injection, fentaNYL 2 mcg/mL w/bupivacaine 0.125% in NS 250 mL, lactated ringers, lidocaine (PF), lidocaine (PF), misoprostol, ondansetron, oxytocin, phenylephrine, phenylephrine, sodium citrate-citric acid   Assessment & Plan:  22 y.o. G2P1001 at [redacted]w[redacted]d admitted for labor, desiring a TOLAC -Labor: Active phase labor. -Fetal Well-being: Category I -GBS: negative -Membranes ruptured, clear fluid @ 0951 -Continue present management., Anticipate vaginal delivery., and Intervention: change maternal position -Analgesia: regional anesthesia   Chari Manning, CNM  08/04/2023 9:59 AM  [facility]

## 2023-08-04 NOTE — Anesthesia Preprocedure Evaluation (Signed)
Anesthesia Evaluation  Patient identified by MRN, date of birth, ID band Patient awake    Reviewed: Allergy & Precautions, H&P , NPO status , Patient's Chart, lab work & pertinent test results  Airway Mallampati: I  TM Distance: >3 FB Neck ROM: Full    Dental no notable dental hx.    Pulmonary neg pulmonary ROS, former smoker   Pulmonary exam normal breath sounds clear to auscultation       Cardiovascular negative cardio ROS Normal cardiovascular exam Rhythm:Regular Rate:Normal     Neuro/Psych negative neurological ROS  negative psych ROS   GI/Hepatic negative GI ROS, Neg liver ROS,,,  Endo/Other  negative endocrine ROS    Renal/GU negative Renal ROS  negative genitourinary   Musculoskeletal negative musculoskeletal ROS (+)    Abdominal   Peds negative pediatric ROS (+)  Hematology negative hematology ROS (+)   Anesthesia Other Findings   Reproductive/Obstetrics negative OB ROS                             Anesthesia Physical Anesthesia Plan  ASA: 2  Anesthesia Plan: Epidural   Post-op Pain Management:    Induction:   PONV Risk Score and Plan:   Airway Management Planned:   Additional Equipment:   Intra-op Plan:   Post-operative Plan:   Informed Consent:   Plan Discussed with:   Anesthesia Plan Comments:        Anesthesia Quick Evaluation

## 2023-08-04 NOTE — Progress Notes (Signed)
L&D Note    Subjective:  Reports feeling pressure with contractions  Objective:   Vitals:   08/04/23 1420 08/04/23 1425 08/04/23 1430 08/04/23 1435  BP:      Pulse:      Resp:      Temp:   98 F (36.7 C)   TempSrc:   Oral   SpO2: 99% 100% 100% 100%  Weight:      Height:        Current Vital Signs 24h Vital Sign Ranges  T 98 F (36.7 C) Temp  Avg: 98.1 F (36.7 C)  Min: 97.7 F (36.5 C)  Max: 98.9 F (37.2 C)  BP 123/72 BP  Min: 110/56  Max: 134/75  HR 79 Pulse  Avg: 91.3  Min: 75  Max: 190  RR 18 Resp  Avg: 19  Min: 18  Max: 20  SaO2 100 %   SpO2  Avg: 98.9 %  Min: 96 %  Max: 100 %      Gen: alert, cooperative, no distress FHR: Baseline: 130 bpm, Variability: moderate, Accels: Present, Decels: variable x1 Toco: regular, every 2-3 minutes SVE: Dilation: 8 Effacement (%): 90 Cervical Position: Anterior Station: 0 Presentation: Vertex Exam by:: LSE  Medications SCHEDULED MEDICATIONS   ammonia       lidocaine (PF)       misoprostol       oxytocin       oxytocin 40 units in LR 1000 mL  333 mL Intravenous Once    MEDICATION INFUSIONS   fentaNYL 2 mcg/mL w/bupivacaine 0.125% in NS 250 mL     lactated ringers     lactated ringers 125 mL/hr at 08/04/23 1246   oxytocin      PRN MEDICATIONS  acetaminophen, ammonia, diphenhydrAMINE, ePHEDrine, ePHEDrine, fentaNYL (SUBLIMAZE) injection, fentaNYL 2 mcg/mL w/bupivacaine 0.125% in NS 250 mL, lactated ringers, lidocaine (PF), lidocaine (PF), misoprostol, ondansetron, oxytocin, phenylephrine, phenylephrine, sodium citrate-citric acid   Assessment & Plan:  22 y.o. G2P1001 at [redacted]w[redacted]d admitted for labor and desires a TOLAC -Labor: Active phase labor., Adequate uterine activity - intensity and frequency., and Satisfactory labor progress. -Fetal Well-being: Category II -GBS: negative -Membranes ruptured, clear fluid -Continue present management. and Anticipate vaginal delivery. -Analgesia: regional anesthesia   Chari Manning, CNM  08/04/2023 2:58 PM  [facility]

## 2023-08-05 LAB — CBC
HCT: 27.8 % — ABNORMAL LOW (ref 36.0–46.0)
Hemoglobin: 9.4 g/dL — ABNORMAL LOW (ref 12.0–15.0)
MCH: 30.5 pg (ref 26.0–34.0)
MCHC: 33.8 g/dL (ref 30.0–36.0)
MCV: 90.3 fL (ref 80.0–100.0)
Platelets: 140 10*3/uL — ABNORMAL LOW (ref 150–400)
RBC: 3.08 MIL/uL — ABNORMAL LOW (ref 3.87–5.11)
RDW: 14.4 % (ref 11.5–15.5)
WBC: 13.4 10*3/uL — ABNORMAL HIGH (ref 4.0–10.5)
nRBC: 0 % (ref 0.0–0.2)

## 2023-08-05 LAB — RPR: RPR Ser Ql: NONREACTIVE

## 2023-08-05 MED ORDER — IBUPROFEN 600 MG PO TABS: 600.0000 mg | ORAL_TABLET | Freq: Four times a day (QID) | ORAL | 0 refills | Status: AC

## 2023-08-05 MED ORDER — BENZOCAINE-MENTHOL 20-0.5 % EX AERO: 1.0000 | INHALATION_SPRAY | CUTANEOUS | Status: AC | PRN

## 2023-08-05 MED ORDER — DIBUCAINE (PERIANAL) 1 % EX OINT: 1.0000 | TOPICAL_OINTMENT | CUTANEOUS | Status: AC | PRN

## 2023-08-05 MED ORDER — MEDROXYPROGESTERONE ACETATE 150 MG/ML IM SUSP: 150.0000 mg | INTRAMUSCULAR | 3 refills | Status: DC

## 2023-08-05 MED ORDER — SENNOSIDES-DOCUSATE SODIUM 8.6-50 MG PO TABS: 2.0000 | ORAL_TABLET | ORAL | Status: AC

## 2023-08-05 MED ORDER — WITCH HAZEL-GLYCERIN EX PADS: 1.0000 | MEDICATED_PAD | CUTANEOUS | Status: AC | PRN

## 2023-08-05 MED ORDER — ACETAMINOPHEN 325 MG PO TABS: 650.0000 mg | ORAL_TABLET | ORAL | Status: AC | PRN

## 2023-08-05 MED ORDER — SIMETHICONE 80 MG PO CHEW: 80.0000 mg | CHEWABLE_TABLET | ORAL | Status: AC | PRN

## 2023-08-05 MED ORDER — VARICELLA VIRUS VACCINE LIVE 1350 PFU/0.5ML IJ SUSR
0.5000 mL | Freq: Once | INTRAMUSCULAR | Status: DC
  Filled 2023-08-05: qty 0.5

## 2023-08-05 MED ORDER — COCONUT OIL OIL: 1.0000 | TOPICAL_OIL | Status: AC | PRN

## 2023-08-05 NOTE — Progress Notes (Signed)
All DC inst given to pt. All questions answered depo shot given right gluteal. Varicella and MMR vaccines on floor had expired and pt did not want to wait for new set to be sent by pharmacy. Said she can receive in clinic. Iv removed per Romeo Apple, RN. Baby id bands verified with mom and dad's. No questions or concerns expressed. DC'd home via [facility] with baby and FOB

## 2023-08-05 NOTE — Lactation Note (Signed)
This note was copied from a baby's chart. Lactation Consultation Note  Patient Name: Catherine Dunn [id number]'O Date: 08/05/2023 Age:24 hours Reason for consult: Initial assessment;1st time breastfeeding;Term;Breastfeeding assistance   Maternal Data This is mom's 2nd baby,SVD.  Mom's medical history is non contributory per chart review.  At first visit mom reports baby has difficulty latching to mom's right breast. Assisted mom with breastfeeding.  Has patient been taught Hand Expression?: Yes Does the patient have breastfeeding experience prior to this delivery?: No (Mom attempted to breastfeed with 1st baby however 1st baby did not latch. Mom also tried pumping for a short time.)  Feeding Mother's Current Feeding Choice: Breast Milk Provided mom with tips and strategies to maximize position and latch technique. Baby was able to latch well in football hold at mom's right breast. Multiple audible swallows noted. LATCH Score Latch: Grasps breast easily, tongue down, lips flanged, rhythmical sucking.  Audible Swallowing: Spontaneous and intermittent  Type of Nipple: Flat (left nipple erects right nipple does not fully erect however when mom "sandwiched" her breast baby was able to latch well.)  Comfort (Breast/Nipple): Soft / non-tender  Hold (Positioning): Assistance needed to correctly position infant at breast and maintain latch. (Baby had difficulty positioning baby at right breast in cross cradle hold. Assisted mom with positioning baby in foottball hold.)  LATCH Score: 8  Interventions Interventions: Breast feeding basics reviewed;Assisted with latch;Breast compression;Adjust position;Support pillows;Position options;Education Reviewed what to expect in the first days when breastfeeding: 8-12 feeds in 24 hours, how to know the baby is getting enough, and cluster feeding.  Discharge Pump: Personal  Consult Status Consult Status: Follow-up Date: 08/06/23 Follow-up type:  In-patient  Update provided to care nurse.  Fuller Song 08/05/2023, 4:08 PM

## 2023-09-29 ENCOUNTER — Encounter: Payer: Self-pay | Admitting: Obstetrics and Gynecology

## 2023-10-31 ENCOUNTER — Ambulatory Visit: Payer: Medicaid Other | Admitting: Family Medicine

## 2023-10-31 ENCOUNTER — Encounter: Payer: Self-pay | Admitting: Family Medicine

## 2023-10-31 VITALS — BP 131/72 | HR 92

## 2023-10-31 DIAGNOSIS — Z3042 Encounter for surveillance of injectable contraceptive: Secondary | ICD-10-CM | POA: Insufficient documentation

## 2023-10-31 DIAGNOSIS — N941 Unspecified dyspareunia: Secondary | ICD-10-CM | POA: Insufficient documentation

## 2023-10-31 DIAGNOSIS — Z309 Encounter for contraceptive management, unspecified: Secondary | ICD-10-CM

## 2023-10-31 DIAGNOSIS — D649 Anemia, unspecified: Secondary | ICD-10-CM | POA: Insufficient documentation

## 2023-10-31 DIAGNOSIS — Z30013 Encounter for initial prescription of injectable contraceptive: Secondary | ICD-10-CM

## 2023-10-31 LAB — HEMOGLOBIN, FINGERSTICK: Hemoglobin: 13.5 g/dL (ref 11.1–15.9)

## 2023-10-31 MED ORDER — MEDROXYPROGESTERONE ACETATE 150 MG/ML IM SUSP
150.0000 mg | INTRAMUSCULAR | Status: AC
  Administered 2023-10-31: 150 mg via INTRAMUSCULAR

## 2023-10-31 NOTE — Assessment & Plan Note (Signed)
 Reported one instance during first and only intercourse after delivery in November. Did not have a pelvic exam at her post partum exam with prenatal clinic. Has not attempted intercourse since, so is unsure if symptoms persistent. Amenable to pelvic exam with speculum today, which was unremarkable. Discussed findings with patient, who feels reassured and may attempt intercourse again in the following weeks.

## 2023-10-31 NOTE — Progress Notes (Signed)
 [facility] [address] [place] [address] Main phone: [phone]  Family Planning Visit - Initial Visit  Subjective:  Catherine Dunn is a 23 y.o.  G3P2002   being seen today for an initial annual visit and to discuss reproductive life planning.  The patient is currently using hormonal injection for pregnancy prevention. Patient does not want a pregnancy in the next year.   Patient reports they are looking for a method with the following characteristics:  High efficacy at preventing pregnancy Minimal bleeding or improved bleeding profile Does not involve insertion   Patient has the following medical conditions: Patient Active Problem List   Diagnosis Date Noted   Encounter for surveillance of injectable contraceptive 10/31/2023   Dyspareunia in female 10/31/2023   Anemia 10/31/2023   Uterine contractions 08/04/2023   Preterm uterine contractions 06/23/2023   Irregular bleeding 01/31/2021   Single liveborn infant, delivered by cesarean 07/02/2020   Chief Complaint  Patient presents with   Annual Exam   HPI Patient reports still bleeding after delivery. She reports 1 pad's worth of blood (not full, but changed for hygiene) per day since ~ 9 weeks s/p delivery. She report this is mostly annoying. Denies dizziness, pre-syncope.  She also reports terribly painful intercourse when she attempted around 7 weeks post partum. She reports pain that felt like paper cuts with alcohol poured on it. Pain subsided when they stopped intercourse.   She reports she did attend her routine post partum check with her prenatal care provider, but they did not check her hemoglobin, did not perform a pelvic exam, and reassured her the complaints were normal. She has not been back for care now that the bleeding has persisted for ~ 3 months.   Review of Systems  Constitutional: Negative.   Genitourinary:  Negative for dysuria, flank  pain, frequency, hematuria and urgency.  Skin: Negative.    Diabetes screening This patient is 23 y.o. with a BMI of There is no height or weight on file to calculate BMI..  Is patient eligible for diabetes screening (age >35 and BMI >25)?  not applicable  Was Hgb A1c ordered? not applicable  STI screening Patient reports 1 of partners in last year.  Does this patient desire STI screening?  No - politely declines  Hepatitis C screening Has patient been screened once for HCV in the past?  No  No results found for: HCVAB  Does the patient meet criteria for HCV testing? No   Hepatitis B screening Does the patient meet criteria for HBV testing? No  Cervical Cancer Screening  Result Date Procedure Results Follow-ups  09/06/2021 Cytology - PAP Adequacy: Satisfactory for evaluation; transformation zone component PRESENT. Diagnosis: - Negative for intraepithelial lesion or malignancy (NILM)   08/12/2020 Cytology - PAP Chlamydia: Negative Neisseria Gonorrhea: Negative Adequacy: Satisfactory for evaluation; transformation zone component PRESENT. Diagnosis: - Negative for intraepithelial lesion or malignancy (NILM) Comment: Normal Reference Ranger Chlamydia - Negative Microorganisms: Fungal organisms present consistent with Candida spp. Comment: Normal Reference Range Neisseria Gonorrhea - Negative    Health Maintenance Due  Topic Date Due   HPV VACCINES (1 - 3-dose series) Never done   Hepatitis C Screening  Never done   COVID-19 Vaccine (3 - 2024-25 season) 05/27/2023   The following portions of the patient's history were reviewed and updated as appropriate: allergies, current medications, past family history, past medical history, past social history, past surgical history and problem list. Problem list  updated.  See flowsheet for other program required questions.  Objective:   Vitals:   10/31/23 1534  BP: 131/72  Pulse: 92   Physical Exam Vitals and nursing note  reviewed. Exam conducted with a chaperone present Catherine Northern, Catherine Dunn).  Constitutional:      Appearance: Normal appearance.  HENT:     Head: Normocephalic and atraumatic.     Mouth/Throat:     Mouth: Mucous membranes are moist.     Pharynx: Oropharynx is clear. No oropharyngeal exudate or posterior oropharyngeal erythema.  Eyes:     General: No scleral icterus. Pulmonary:     Effort: Pulmonary effort is normal.  Abdominal:     General: Abdomen is flat.  Genitourinary:    General: Normal vulva.     Exam position: Lithotomy position.     Pubic Area: No rash or pubic lice.      Tanner stage (genital): 5.     Labia:        Right: No rash or lesion.        Left: No rash or lesion.      Vagina: Normal. No vaginal discharge, erythema, bleeding or lesions.     Cervix: No cervical motion tenderness, discharge, friability, lesion or erythema.  Lymphadenopathy:     Head:     Right side of head: No preauricular or posterior auricular adenopathy.     Left side of head: No preauricular or posterior auricular adenopathy.     Upper Body:     Right upper body: No supraclavicular, axillary or epitrochlear adenopathy.     Left upper body: No supraclavicular, axillary or epitrochlear adenopathy.     Lower Body: No right inguinal adenopathy. No left inguinal adenopathy.  Skin:    General: Skin is warm and dry.     Findings: No rash.  Neurological:     Mental Status: She is alert and oriented to person, place, and time.  Psychiatric:        Mood and Affect: Mood normal.        Behavior: Behavior normal.    Assessment and Plan:  Catherine Dunn is a 23 y.o. female presenting to the [facility] Department for an initial annual wellness/contraceptive visit  Contraception counseling: Reviewed options based on patient desire and reproductive life plan. Patient is interested in Hormonal Injection. This was provided to the patient today.  Risks, benefits, and typical effectiveness rates were  reviewed.  Questions were answered.  Written information was also given to the patient to review.    The patient will follow up in  3 months for surveillance.  The patient was told to call with any further questions, or with any concerns about this method of contraception.  Emphasized use of condoms 100% of the time for STI prevention.  Educated on ECP and assessed for need of ECP. Not indicated as she is still within dates for last depo provera  injection.   Encounter for surveillance of injectable contraceptive Assessment & Plan: Within dates from depo provera  given s/p delivery. With physical completed today, will write one year prescription.   Orders: -     medroxyPROGESTERone  Acetate  Anemia, unspecified type Assessment & Plan: Hgb 9.4 on discharge from hospital s/p delivery in November. Has not been rechecked since. Given daily bleeding, will check fingerstick hemoglobin today and treat with PO iron accordingly.   Orders: -     Hemoglobin, fingerstick  Dyspareunia in female Assessment & Plan: Reported one instance during first and  only intercourse after delivery in November. Did not have a pelvic exam at her post partum exam with prenatal clinic. Has not attempted intercourse since, so is unsure if symptoms persistent. Amenable to pelvic exam with speculum today, which was unremarkable. Discussed findings with patient, who feels reassured and may attempt intercourse again in the following weeks.     No follow-ups on file.  No future appointments.   Betsey CHRISTELLA Helling, MD

## 2023-10-31 NOTE — Assessment & Plan Note (Signed)
 Hgb 9.4 on discharge from hospital s/p delivery in November. Has not been rechecked since. Given daily bleeding, will check fingerstick hemoglobin today and treat with PO iron accordingly.

## 2023-10-31 NOTE — Progress Notes (Signed)
 Pt is here for family planning visit.  Family planning packet reviewed and given to pt.  Hemoglobin results reviewed, no treatment required per standing orders. Condoms declined. Depo given in [facility] and tolerated well, reminder card given. Larraine JONELLE Northern, RN

## 2023-10-31 NOTE — Assessment & Plan Note (Signed)
 Within dates from depo provera  given s/p delivery. With physical completed today, will write one year prescription.

## 2023-12-10 ENCOUNTER — Encounter: Payer: Self-pay | Admitting: Family Medicine

## 2024-06-06 ENCOUNTER — Other Ambulatory Visit: Payer: Self-pay | Admitting: Medical Genetics

## 2024-06-09 ENCOUNTER — Other Ambulatory Visit

## 2024-06-10 ENCOUNTER — Other Ambulatory Visit
Admission: RE | Admit: 2024-06-10 | Discharge: 2024-06-10 | Disposition: A | Discharge status: Home / Self Care | Payer: Self-pay | Source: Ambulatory Visit | Attending: Medical Genetics | Admitting: Medical Genetics

## 2024-06-20 LAB — GENECONNECT MOLECULAR SCREEN: Genetic Analysis Overall Interpretation: NEGATIVE
